# Patient Record
Sex: Male | Born: 1995 | Race: White | Hispanic: No | Marital: Married | Smoking: Never smoker
Health system: Southern US, Community
[De-identification: ages and names within clinical notes are randomized; demographics above are authoritative.]

## PROBLEM LIST (undated history)

## (undated) DIAGNOSIS — T7840XA Allergy, unspecified, initial encounter: Secondary | ICD-10-CM

## (undated) DIAGNOSIS — K121 Other forms of stomatitis: Secondary | ICD-10-CM

## (undated) DIAGNOSIS — K611 Rectal abscess: Secondary | ICD-10-CM

## (undated) DIAGNOSIS — K529 Noninfective gastroenteritis and colitis, unspecified: Secondary | ICD-10-CM

## (undated) DIAGNOSIS — K603 Anal fistula, unspecified: Secondary | ICD-10-CM

## (undated) DIAGNOSIS — K219 Gastro-esophageal reflux disease without esophagitis: Secondary | ICD-10-CM

## (undated) DIAGNOSIS — K5090 Crohn's disease, unspecified, without complications: Secondary | ICD-10-CM

## (undated) HISTORY — PX: UPPER GASTROINTESTINAL ENDOSCOPY: SHX188

## (undated) HISTORY — DX: Noninfective gastroenteritis and colitis, unspecified: K52.9

## (undated) HISTORY — DX: Anal fistula, unspecified: K60.30

## (undated) HISTORY — DX: Gastro-esophageal reflux disease without esophagitis: K21.9

## (undated) HISTORY — PX: COLONOSCOPY: SHX174

## (undated) HISTORY — DX: Rectal abscess: K61.1

## (undated) HISTORY — PX: NO PAST SURGERIES: SHX2092

## (undated) HISTORY — PX: WISDOM TOOTH EXTRACTION: SHX21

## (undated) HISTORY — DX: Crohn's disease, unspecified, without complications: K50.90

## (undated) HISTORY — DX: Other forms of stomatitis: K12.1

## (undated) HISTORY — DX: Anal fistula: K60.3

## (undated) HISTORY — DX: Allergy, unspecified, initial encounter: T78.40XA

---
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
## 2019-03-27 ENCOUNTER — Other Ambulatory Visit: Payer: Self-pay

## 2019-03-27 DIAGNOSIS — Z20822 Contact with and (suspected) exposure to covid-19: Secondary | ICD-10-CM

## 2019-03-28 LAB — NOVEL CORONAVIRUS, NAA: SARS-CoV-2, NAA: NOT DETECTED

## 2019-08-25 ENCOUNTER — Ambulatory Visit: Payer: 59 | Attending: Internal Medicine

## 2019-08-25 DIAGNOSIS — Z20822 Contact with and (suspected) exposure to covid-19: Secondary | ICD-10-CM

## 2019-08-27 ENCOUNTER — Telehealth: Payer: Self-pay | Admitting: Adult Health

## 2019-08-27 LAB — NOVEL CORONAVIRUS, NAA: SARS-CoV-2, NAA: DETECTED — AB

## 2019-08-27 NOTE — Telephone Encounter (Signed)
Patient called about Positive Covid test.  2 patient identifiers confirmed.  Date Tested: 08/25/2019  Date of Symptom onset: 08/25/2019   Symptoms: Mild      Isolation Recommendations:  Patient understands the needs to stay in isolation for a total of 10 days from onset of symptom or 14 days total from date of testing if no symptom. Reviewed Masking.    Supportive Care Recommendations: Encouraged plenty of fluid intake, Tylenol per package directions, and to remain as active as possible.    Patient knows the health department may be in touch.    I answered all of patient's questions and all concerns addressed.  Time Spent: 8 minutes  Wilber Bihari, NP

## 2019-12-13 DIAGNOSIS — K121 Other forms of stomatitis: Secondary | ICD-10-CM | POA: Insufficient documentation

## 2019-12-13 DIAGNOSIS — J039 Acute tonsillitis, unspecified: Secondary | ICD-10-CM | POA: Insufficient documentation

## 2019-12-13 DIAGNOSIS — R438 Other disturbances of smell and taste: Secondary | ICD-10-CM | POA: Insufficient documentation

## 2020-01-05 ENCOUNTER — Other Ambulatory Visit (INDEPENDENT_AMBULATORY_CARE_PROVIDER_SITE_OTHER): Payer: 59

## 2020-01-05 ENCOUNTER — Encounter: Payer: Self-pay | Admitting: Physician Assistant

## 2020-01-05 ENCOUNTER — Ambulatory Visit: Payer: 59 | Admitting: Physician Assistant

## 2020-01-05 ENCOUNTER — Other Ambulatory Visit: Payer: 59

## 2020-01-05 ENCOUNTER — Telehealth: Payer: Self-pay

## 2020-01-05 VITALS — BP 104/60 | HR 64 | Ht 71.0 in | Wt 150.1 lb

## 2020-01-05 DIAGNOSIS — K6289 Other specified diseases of anus and rectum: Secondary | ICD-10-CM

## 2020-01-05 DIAGNOSIS — K625 Hemorrhage of anus and rectum: Secondary | ICD-10-CM | POA: Diagnosis not present

## 2020-01-05 DIAGNOSIS — R1084 Generalized abdominal pain: Secondary | ICD-10-CM

## 2020-01-05 DIAGNOSIS — K611 Rectal abscess: Secondary | ICD-10-CM

## 2020-01-05 DIAGNOSIS — R194 Change in bowel habit: Secondary | ICD-10-CM

## 2020-01-05 DIAGNOSIS — R140 Abdominal distension (gaseous): Secondary | ICD-10-CM

## 2020-01-05 LAB — CBC WITH DIFFERENTIAL/PLATELET
Basophils Absolute: 0.1 10*3/uL (ref 0.0–0.1)
Basophils Relative: 0.9 % (ref 0.0–3.0)
Eosinophils Absolute: 0.3 10*3/uL (ref 0.0–0.7)
Eosinophils Relative: 3.7 % (ref 0.0–5.0)
HCT: 41.4 % (ref 39.0–52.0)
Hemoglobin: 13.7 g/dL (ref 13.0–17.0)
Lymphocytes Relative: 18 % (ref 12.0–46.0)
Lymphs Abs: 1.3 10*3/uL (ref 0.7–4.0)
MCHC: 33.1 g/dL (ref 30.0–36.0)
MCV: 81.5 fl (ref 78.0–100.0)
Monocytes Absolute: 0.8 10*3/uL (ref 0.1–1.0)
Monocytes Relative: 11.5 % (ref 3.0–12.0)
Neutro Abs: 4.6 10*3/uL (ref 1.4–7.7)
Neutrophils Relative %: 65.9 % (ref 43.0–77.0)
Platelets: 281 10*3/uL (ref 150.0–400.0)
RBC: 5.08 Mil/uL (ref 4.22–5.81)
RDW: 14.3 % (ref 11.5–15.5)
WBC: 7 10*3/uL (ref 4.0–10.5)

## 2020-01-05 LAB — COMPREHENSIVE METABOLIC PANEL
ALT: 11 U/L (ref 0–53)
AST: 13 U/L (ref 0–37)
Albumin: 4.1 g/dL (ref 3.5–5.2)
Alkaline Phosphatase: 75 U/L (ref 39–117)
BUN: 14 mg/dL (ref 6–23)
CO2: 30 mEq/L (ref 19–32)
Calcium: 9.3 mg/dL (ref 8.4–10.5)
Chloride: 105 mEq/L (ref 96–112)
Creatinine, Ser: 1.04 mg/dL (ref 0.40–1.50)
GFR: 88.06 mL/min (ref >60.00)
Glucose, Bld: 94 mg/dL (ref 70–99)
Potassium: 5.1 mEq/L (ref 3.5–5.1)
Sodium: 138 mEq/L (ref 135–145)
Total Bilirubin: 0.3 mg/dL (ref 0.2–1.2)
Total Protein: 7.5 g/dL (ref 6.0–8.3)

## 2020-01-05 LAB — SEDIMENTATION RATE: Sed Rate: 34 mm/hr — ABNORMAL HIGH (ref 0–15)

## 2020-01-05 LAB — IGA: IgA: 341 mg/dL (ref 68–378)

## 2020-01-05 LAB — C-REACTIVE PROTEIN: CRP: 2.9 mg/dL (ref 0.5–20.0)

## 2020-01-05 MED ORDER — SUTAB 1479-225-188 MG PO TABS: 1.0000 | ORAL_TABLET | Freq: Once | ORAL | 0 refills | Status: AC

## 2020-01-05 MED ORDER — METOCLOPRAMIDE HCL 10 MG PO TABS
ORAL_TABLET | ORAL | 0 refills | Status: DC
Start: 2020-01-05 — End: 2020-02-21

## 2020-01-05 MED ORDER — AMOXICILLIN-POT CLAVULANATE 500-125 MG PO TABS
1.0000 | ORAL_TABLET | Freq: Two times a day (BID) | ORAL | 0 refills | Status: DC
Start: 2020-01-05 — End: 2020-02-21

## 2020-01-05 MED ORDER — HYDROCORTISONE (PERIANAL) 2.5 % EX CREA: 1.0000 "application " | TOPICAL_CREAM | Freq: Two times a day (BID) | CUTANEOUS | 1 refills | Status: DC

## 2020-01-05 NOTE — Progress Notes (Signed)
Chief Complaint: Blood in stool, rectal pain, change in bowel habits, abdominal pain  HPI:    Carlos Jackson is a 24 year old male with past medical history as listed below, who presents to clinic today with a complaint of blood in his stool, rectal pain, change in bowel habits, abdominal pain.      03/14/2015 notes from PCP show that patient had discussed chronic constipation with rectal bleeding in the past.  Apparently had been seen in a walk-in clinic and told he had an anal fissure and was treated with Preparation H.    Patient had Covid in January of this year.    Today, the patient presents to clinic accompanied by his mother who does assist with history.  He has multiple GI issues which have been going on for years.  Starts by explaining that when he was 13 or 14 he started to have issues with constipation and had a fissure at some point.  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  Tells me if he eats dairy now he will have straight up diarrhea.  Also has a history of vomiting, apparently this occurred almost anytime he ate a few years back and so he did not eat a lot as he was nervous and anxious to vomit again.  He lost a lot of weight at that time.  In 2018 he continued with reflux and episodes of vomiting and had an EGD at [facility].  We cannot see the report in the computer but he tells me he had something biopsied and it came back okay.  Explains that over the past 2 years he has not had real trouble with constipation or diarrhea but is just "irregular".  Explains that he will see bright red blood on the tissue paper/from his rectum about 1 time a month.  He also has "itchy and painful hemorrhoids".  He sometimes uses over-the-counter Preparation H for these.  Associated symptoms include aches and chills at times later in the day as well as fatigue.    Another large complaint today is that over the past 2 to 3 weeks he has developed a "bump" in between his  rectum and testicles which he can express liquid out of, sometimes it is just gas and other times it is a Pacey/clear liquid or even sometimes blood-tinged liquid.  Along with this has almost constant leakage from his anus.    Also describes a history of mouth sores telling me that these are not "aphthous ulcers", but sores which open up in his mouth and make it harder for him to eat comfortably.    Also history of reflux which he does not feel is a big problem at this point in time but does describe a lot of gas when he eats and sometimes having to lay down for 45 minutes to get gas out of his system after eating anything.  Apparently was on Nexium at some point but his wife is trying to change his diet to a cleaner one so that he does not have to be on medicine.  He will use Tums occasionally.  Also tells me that he eats late at night around 8:00 and will go to bed around 10 which he thinks is not helping.    History of being diagnosed with Covid in January and then had a staph infection for which he was on 2 weeks of antibiotics and then strep throat for which he was on another 10  days of antibiotics.    Denies fever or symptoms that awaken him from sleep.  Past Medical History:  Diagnosis Date  . Anal fissure 2014    Past Surgical History:  Procedure Laterality Date  . NO PAST SURGERIES      No current outpatient medications on file.   No current facility-administered medications for this visit.    Allergies as of 01/05/2020  . (No Known Allergies)    Family History  Problem Relation Age of Onset  . Depression Mother   . Anxiety disorder Mother   . OCD Mother   . Breast cancer Maternal Grandmother   . Hypertension Maternal Grandfather   . Heart block Maternal Grandfather   . Crohn's disease Cousin     Social History   Socioeconomic History  . Marital status: Married    Spouse name: Not on file  . Number of children: 0  . Years of education: Not on file  . Highest  education level: Not on file  Occupational History  . 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 Smoking status: Never Smoker  . Smokeless tobacco: Never Used  Substance and Sexual Activity  . Alcohol use: Yes    Comment: rarely  . Drug use: Never  . Sexual activity: Not on file  Other Topics Concern  . Not on file  Social History Narrative  . Not on file   Social Determinants of Health   Financial Resource Strain:   . Difficulty of Paying Living Expenses:   Food Insecurity:   . Worried About Charity fundraiser in the Last Year:   . Arboriculturist in the Last Year:   Transportation Needs:   . Film/video editor (Medical):   Marland Kitchen Lack of Transportation (Non-Medical):   Physical Activity:   . Days of Exercise per Week:   . Minutes of Exercise per Session:   Stress:   . Feeling of Stress :   Social Connections:   . Frequency of Communication with Friends and Family:   . Frequency of Social Gatherings with Friends and Family:   . Attends Religious Services:   . Active Member of Clubs or Organizations:   . Attends Archivist Meetings:   Marland Kitchen Marital Status:   Intimate Partner Violence:   . Fear of Current or Ex-Partner:   . Emotionally Abused:   Marland Kitchen Physically Abused:   . Sexually Abused:     Review of Systems:    Constitutional: No weight loss, fever or chills Skin: No rash or itching Cardiovascular: No chest pain  Respiratory: No SOB  Gastrointestinal: See HPI and otherwise negative Genitourinary: No dysuria  Neurological: No headache, dizziness or syncope Musculoskeletal: No new muscle or joint pain Hematologic: No  bruising Psychiatric: +anxiety   Physical Exam:  Vital signs: BP 104/60 (BP Location: Left Arm, Patient Position: Sitting, Cuff Size: Normal)   Pulse 64 Comment: irregular  Ht 5' 11"  (1.803 m) Comment: height measured without shoes  Wt 150 lb 2 oz (68.1 kg)   BMI 20.94 kg/m   Constitutional:   Pleasant Caucasian male appears to  be in NAD, Well developed, Well nourished, alert and cooperative Head:  Normocephalic and atraumatic. Eyes:   PEERL, EOMI. No icterus. Conjunctiva pink. Ears:  Normal auditory acuity. Neck:  Supple Throat: Oral cavity and pharynx without inflammation, swelling or lesion.  Respiratory: Respirations even and unlabored. Lungs clear to auscultation bilaterally.   No wheezes, crackles, or rhonchi.  Cardiovascular: Normal  S1, S2. No MRG. Regular rate and rhythm. No peripheral edema, cyanosis or pallor.  Gastrointestinal:  Soft, nondistended, mild generalized abdominal ttp. No rebound or guarding. Normal bowel sounds. No appreciable masses or hepatomegaly. Rectal: External: dermatitis along buttocks, moisture, multiple external hemorrhoids, fluctuant area on the left side of perineum which expresses bloody discharge out the rectum when palpated; Internal: hemorrhoids Msk:  Symmetrical without gross deformities. Without edema, no deformity or joint abnormality.  Neurologic:  Alert and  oriented x4;  grossly normal neurologically.  Skin:   Dry and intact without significant lesions or rashes. Psychiatric: Demonstrates good judgement and reason without abnormal affect or behaviors.  Assessment: 1.  Change in bowel habits: Some days of constipation and other days of regular stool, with mouth ulcers and rectal fistula/abscess and fissures consider IBD versus other 2.  Perirectal abscess: Palpated during time of exam today 3.  Rectal bleeding: Likely to related to above plus/minus hemorrhoids 4.  Mouth ulcers 5.  Generalized abdominal pain 6.  Heartburn/reflux/nausea: Question functional component given anxiety over GI system 7.  Bloating/gas  Plan: 1.  Ordered a CT of the pelvis with contrast for further evaluation of what appears to be an abscess. 2.  Scheduled patient for colonoscopy in the [place] with Dr. Hilarie Fredrickson.  Did discuss risks, benefits, limitations and alternatives and patient agrees to proceed.   This was scheduled a few weeks out to allow for CT to come back. 3.  Prescribed Hydrocortisone ointment to be applied twice daily to the buttocks and hemorrhoids for 2 weeks. 4.  Prescribed Augmentin 581m BIDx5 days for suspected abscess #10 no refills 5.  Continue high-fiber diet, probiotic and water. 6.  Prescribe Metoclopramide 10 mg #2, to be taken 20 to 30 minutes before each half of prep 7.  Ordered labs to include a CBC, CMP, CRP, ESR and celiac testing 8.  Patient to follow in clinic per recommendations from Dr. PHilarie Fredricksonafter time of procedure.  JEllouise Newer PA-C [facility] 01/05/2020, 10:38 AM

## 2020-01-05 NOTE — Patient Instructions (Signed)
.  If you are age 24 or older, your body mass index should be between 23-30. Your Body mass index is 20.94 kg/m. If this is out of the aforementioned range listed, please consider follow up with your Primary Care Provider.  If you are age 12 or younger, your body mass index should be between 19-25. Your Body mass index is 20.94 kg/m. If this is out of the aformentioned range listed, please consider follow up with your Primary Care Provider.   Your provider has requested that you go to the basement level for lab work before leaving today. Press "B" on the elevator. The lab is located at the first door on the left as you exit the elevator.  We have sent the following medications to your pharmacy for you to pick up at your convenience: Hydrocortisone ointment to peri-anal area twice daily for 14 days. Reglan 10 mg - take one tablet 30 minutes before each half of prep.    You have been scheduled for a CT scan of the abdomen and pelvis at [facility] ([address]---this is in the same building as [employer]).   You are scheduled on Thursday 01/11/20 at 2:30 pm. You should arrive 15 minutes prior to your appointment time for registration. Please follow the written instructions below on the day of your exam:  WARNING: IF YOU ARE ALLERGIC TO IODINE/X-RAY DYE, PLEASE NOTIFY RADIOLOGY IMMEDIATELY AT [phone]! YOU WILL BE GIVEN A 13 HOUR PREMEDICATION PREP.  1) Do not eat or drink anything after 10:30 am (4 hours prior to your test) 2) You have been given 2 bottles of oral contrast to drink. The solution may taste better if refrigerated, but do NOT add ice or any other liquid to this solution. Shake well before drinking.    Drink 1 bottle of contrast @ 12:30 pm (2 hours prior to your exam)  Drink 1 bottle of contrast @ 1:30 pm (1 hour prior to your exam)  You may take any medications as prescribed with a small amount of water, if necessary. If you take any of the  following medications: METFORMIN, GLUCOPHAGE, GLUCOVANCE, AVANDAMET, RIOMET, FORTAMET, [place] MET, JANUMET, GLUMETZA or METAGLIP, you MAY be asked to HOLD this medication 48 hours AFTER the exam.  The purpose of you drinking the oral contrast is to aid in the visualization of your intestinal tract. The contrast solution may cause some diarrhea. Depending on your individual set of symptoms, you may also receive an intravenous injection of x-ray contrast/dye. Plan on being at [facility] for 30 minutes or longer, depending on the type of exam you are having performed.  This test typically takes 30-45 minutes to complete.  If you have any questions regarding your exam or if you need to reschedule, you may call the CT department at [phone] between the hours of 8:00 am and 5:00 pm, Monday-Friday.  __________________________________________________________________  Carlos Jackson have been scheduled for a colonoscopy. Please follow written instructions given to you at your visit today.  Please pick up your prep supplies at the pharmacy within the next 1-3 days. If you use inhalers (even only as needed), please bring them with you on the day of your procedure.  Follow up in clinic per recommendations from Dr. Hilarie Fredrickson after procedure.

## 2020-01-05 NOTE — Telephone Encounter (Signed)
Lm on vm that per Carlos Jackson, I sent in [place] for him to take twice a day for 5 days.

## 2020-01-09 LAB — TISSUE TRANSGLUTAMINASE ABS,IGG,IGA
(tTG) Ab, IgA: <1 U/mL
(tTG) Ab, IgG: 2 U/mL

## 2020-01-09 NOTE — Progress Notes (Signed)
Addendum: Reviewed and agree with assessment and management plan. Kaye Luoma M, MD  

## 2020-01-11 ENCOUNTER — Ambulatory Visit: Payer: Self-pay

## 2020-01-11 ENCOUNTER — Other Ambulatory Visit: Payer: Self-pay

## 2020-01-11 ENCOUNTER — Ambulatory Visit (INDEPENDENT_AMBULATORY_CARE_PROVIDER_SITE_OTHER)
Admission: RE | Admit: 2020-01-11 | Discharge: 2020-01-11 | Disposition: A | Discharge status: Home / Self Care | Payer: 59 | Source: Ambulatory Visit | Attending: Physician Assistant | Admitting: Physician Assistant

## 2020-01-11 DIAGNOSIS — K611 Rectal abscess: Secondary | ICD-10-CM | POA: Diagnosis not present

## 2020-01-11 DIAGNOSIS — K6289 Other specified diseases of anus and rectum: Secondary | ICD-10-CM | POA: Diagnosis not present

## 2020-01-11 DIAGNOSIS — K625 Hemorrhage of anus and rectum: Secondary | ICD-10-CM

## 2020-01-11 DIAGNOSIS — R1084 Generalized abdominal pain: Secondary | ICD-10-CM | POA: Diagnosis not present

## 2020-01-11 DIAGNOSIS — R194 Change in bowel habit: Secondary | ICD-10-CM

## 2020-01-11 MED ORDER — IOHEXOL 300 MG/ML  SOLN
100.0000 mL | Freq: Once | INTRAMUSCULAR | Status: AC | PRN
  Administered 2020-01-11: 100 mL via INTRAVENOUS

## 2020-01-11 NOTE — Telephone Encounter (Signed)
Patient called stating that he had a CT scan done today because he has a area at his anus that is unusual He states the GI doctor thinks it might be an abscess. He says this area leaks fluid. Today he had leaking and some bleeding but is most concerned because he found a pea gravel size piece of tissue. He denies pain itching. He has no fever. Patient was advised to try to reach his PCP tonight for advice. He was told he should go to er for severe pain bleeding that continues or fever. Any unusual symptom. He verbalized understanding and will try to reach his PCP office on call. He will also contact his GI physician.  Reason for Disposition . [1] Home treatment > 3 days for rectal pain AND [2] not improved  Answer Assessment - Initial Assessment Questions 1. SYMPTOM:  "What's the main symptom you're concerned about?" (e.g., pain, itching, swelling, rash)     Leaking and with tissue 2nd time bloody 2. ONSET: "When did the lastweek saw GI start?"     Rectal bleeding 3. RECTAL PAIN: "Do you have any pain around your rectum?" "How bad is the pain?"  (Scale 1-10; or mild, moderate, severe)  - MILD (1-3): doesn't interfere with normal activities   - MODERATE (4-7): interferes with normal activities or awakens from sleep, limping   - SEVERE (8-10): excruciating pain, unable to have a bowel movement      none 4. RECTAL ITCHING: "Do you have any itching in this area?" "How bad is the itching?"  (Scale 1-10; or mild, moderate, severe)  - MILD - doesn't interfere with normal activities   - MODERATE-SEVERE: interferes with normal activities or awakens from sleep   extermal hemrhoids 5. CONSTIPATION: "Do you have constipation?" If so, "How bad is it?"   no 6. CAUSE: "What do you think is causing the anus symptoms?"     Unsure CT for most symptoms 7. OTHER SYMPTOMS: "Do you have any other symptoms?"  (e.g., rectal bleeding, abdominal pain, vomiting, fever)     blood 8. PREGNANCY: "Is there any chance you  are pregnant?" "When was your last menstrual period?"    N/A  Protocols used: RE[facility]AL [facility]

## 2020-01-12 ENCOUNTER — Telehealth: Payer: Self-pay | Admitting: Physician Assistant

## 2020-01-12 NOTE — Telephone Encounter (Signed)
Pt stated that he had a bm and passed "a small piece of tissue" after he had CT.  Pt is concerned and would like a call back.

## 2020-01-12 NOTE — Telephone Encounter (Signed)
The pt had CT scan yesterday and states he passed what looks like tissue.  He said it was a small amount of BRB at that time and then everything went back to normal for him.  He will await a call for CT results.

## 2020-01-23 NOTE — Telephone Encounter (Signed)
Rather than remaining on Augmentin, I would recommend we try metronidazole 250 mg 3 times daily for 3 to 4 weeks

## 2020-01-24 ENCOUNTER — Telehealth: Payer: Self-pay | Admitting: Gastroenterology

## 2020-01-24 ENCOUNTER — Telehealth: Payer: Self-pay | Admitting: Physician Assistant

## 2020-01-24 ENCOUNTER — Other Ambulatory Visit: Payer: Self-pay

## 2020-01-24 MED ORDER — METRONIDAZOLE 250 MG PO TABS
250.0000 mg | ORAL_TABLET | Freq: Three times a day (TID) | ORAL | 0 refills | Status: DC
Start: 2020-01-24 — End: 2020-10-01

## 2020-01-24 NOTE — Telephone Encounter (Signed)
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 CRP, albumin, hemoglobin, MCV normal. ESR elevated at 34.  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 Colonoscopy planned later this month for further evaluation of possible Crohn's disease. The evening of the CT scan he passed a piece of tissue/mucosa that was worrisome to him. Felt like this concern was not addressed after sending a MyChart message about it. He has had normal bowel movements since then. Called the office yesterday with concerns for increasing fistula pain, severe enough that he has been avoiding any movement. He was started on Flagyl yesterday and took the first dose earlier today.   This evening, pain escalated followed by a pop. He has noticed more purulent discharge since that time, although the pain may be somewhat improved. No fever, chills, or night sweats.   Discussed diagnosis and management of fistula +/- abscess. Given patient's discomfort and anxiety about his diagnosis, I recommended that he go to the ER for evaluation as I am unable to fully evaluate his concerns/perform anorectal exam over the phone.   Will update Dr. Hilarie Fredrickson to arrange for office follow-up, endoscopic evaluation, and surgical consultation.   Vaughan Basta, please call the patient 01/25/20 for follow-up in his symptoms.

## 2020-01-24 NOTE — Telephone Encounter (Signed)
Please see MyChart message.  Pt reported that he has finished his course of antibiotics and is experiencing pain.  He stated that "it hurts to stand and to sit."  Pt requested to speak to a nurse.  He stated that "I keep getting MyChart messages but no one has called me to address this issue."

## 2020-01-24 NOTE — Telephone Encounter (Signed)
Spoke with pt and he is aware of Dr. Quentin Mulling recommendations, antibiotic sent to pharmacy and pt knows to start Flagyl.

## 2020-01-25 ENCOUNTER — Other Ambulatory Visit: Payer: Self-pay

## 2020-01-25 ENCOUNTER — Emergency Department (HOSPITAL_COMMUNITY)
Admission: EM | Admit: 2020-01-25 | Discharge: 2020-01-25 | Disposition: A | Discharge status: Home / Self Care | Payer: 59 | Attending: Emergency Medicine | Admitting: Emergency Medicine

## 2020-01-25 ENCOUNTER — Encounter (HOSPITAL_COMMUNITY): Payer: Self-pay

## 2020-01-25 ENCOUNTER — Emergency Department (HOSPITAL_COMMUNITY): Payer: 59

## 2020-01-25 DIAGNOSIS — K603 Anal fistula: Secondary | ICD-10-CM | POA: Diagnosis not present

## 2020-01-25 DIAGNOSIS — K6289 Other specified diseases of anus and rectum: Secondary | ICD-10-CM | POA: Diagnosis present

## 2020-01-25 DIAGNOSIS — Z79899 Other long term (current) drug therapy: Secondary | ICD-10-CM | POA: Diagnosis not present

## 2020-01-25 LAB — CBC
HCT: 44.7 % (ref 39.0–52.0)
Hemoglobin: 14.2 g/dL (ref 13.0–17.0)
MCH: 27 pg (ref 26.0–34.0)
MCHC: 31.8 g/dL (ref 30.0–36.0)
MCV: 85.1 fL (ref 80.0–100.0)
Platelets: 253 10*3/uL (ref 150–400)
RBC: 5.25 MIL/uL (ref 4.22–5.81)
RDW: 13.3 % (ref 11.5–15.5)
WBC: 5.8 10*3/uL (ref 4.0–10.5)
nRBC: 0 % (ref 0.0–0.2)

## 2020-01-25 LAB — BASIC METABOLIC PANEL
Anion gap: 9 (ref 5–15)
BUN: 20 mg/dL (ref 6–20)
CO2: 28 mmol/L (ref 22–32)
Calcium: 9 mg/dL (ref 8.9–10.3)
Chloride: 105 mmol/L (ref 98–111)
Creatinine, Ser: 0.97 mg/dL (ref 0.61–1.24)
GFR calc Af Amer: >60 mL/min (ref >60)
GFR calc non Af Amer: >60 mL/min (ref >60)
Glucose, Bld: 92 mg/dL (ref 70–99)
Potassium: 4.2 mmol/L (ref 3.5–5.1)
Sodium: 142 mmol/L (ref 135–145)

## 2020-01-25 MED ORDER — GADOBUTROL 1 MMOL/ML IV SOLN
7.0000 mL | Freq: Once | INTRAVENOUS | Status: AC | PRN
  Administered 2020-01-25: 7 mL via INTRAVENOUS

## 2020-01-25 MED ORDER — SENNOSIDES-DOCUSATE SODIUM 8.6-50 MG PO TABS: 1.0000 | ORAL_TABLET | Freq: Every day | ORAL | 0 refills | Status: DC

## 2020-01-25 MED ORDER — MIDAZOLAM HCL 2 MG/2ML IJ SOLN
2.0000 mg | Freq: Once | INTRAMUSCULAR | Status: AC | PRN
  Administered 2020-01-25: 2 mg via INTRAVENOUS
  Filled 2020-01-25: qty 2

## 2020-01-25 NOTE — Discharge Instructions (Signed)
Your GI doctor's office should call you today or tomorrow to arrange follow up, and possibly move up the date of your colonoscopy.  Please keep taking your metronidazole (flagyl) antibiotic at home to cover for possible infections.  Do not drink alcohol while taking this antibiotic - it will make you sick.  Your GI doctor will determine whether you need to see a colorectal surgeon for your fistula.  Your MRI today did not show a drainable abscess or deep infection needing emergent surgery.

## 2020-01-25 NOTE — ED Notes (Signed)
Discharge paperwork discussed with patient and wife.

## 2020-01-25 NOTE — Telephone Encounter (Signed)
Thanks to Dr. Tarri Glenn for her assistance last night. Pt is being evaluated in the ER currently I have not met the patient before, but he was seen recently by Ellouise Newer, [facility] and has CT scan abd/pelvis  I can try and expedite his evaluation.  Colonoscopy could be moved to next Tuesday, 01/30/2020 at 1:30 PM if patient agreeable I also would recommend we proceed with MRI pelvis with contrast to better image and delineate rectal/perianal fistula/pathology He can continue the metronidazole 250 mg TID as prescribed  Please let me know if he has questions

## 2020-01-25 NOTE — ED Triage Notes (Signed)
Pt presents with c/o possible anal fistula rupture. 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 Last night, pt felt a "pop" and had some fluid drain from his rectum, both blood and pus. Pt reports the liquid was pink in nature Pt's on-call GI reported there may also be an abscess that ruptured that they did not see on the CT.

## 2020-01-25 NOTE — ED Notes (Signed)
Patient to MRI.

## 2020-01-25 NOTE — ED Provider Notes (Signed)
[facility] DEPT Provider Note   CSN: [id number] Arrival date & time: 01/25/20  0813     History Chief Complaint  Patient presents with  . Fistula rupture    Carlos Jackson is a 24 y.o. male who is currently undergoing evaluation for Crohn's disease present emergency department with possible anal abscess or fistula rupture.  Patient reports that he began having swelling and pain in his perianal region approximately 3 days ago.  He says that 2 days ago he felt a pop and began having leakage of fluid from around his anus, both inside his anus and externally.  He said it was pink and purulent looking fluid.  It was dripping steadily.  He called [facility] yesterday evening and spoke to the oncall provider, who started him on Flagyl (he has taken 3 doses so far) and advised coming to the ER for evaluation.  Patient has endoscopy planned for 6/30 with Dr Hilarie Fredrickson for Crohn's/IBS evaluation.    He had a CT abdomen/pelvis on 01/11/20 which showed:   IMPRESSION: 1. Left-sided perianal fistula, incompletely visualized on this exam. No abscess identified. Pelvic MRI without contrast could be for further evaluation if desired. 2. Large stool burden noted, possibly due to constipation.   No other medical problems, NKDA  HPI     Past Medical History:  Diagnosis Date  . Anal fissure 2014    There are no problems to display for this patient.   Past Surgical History:  Procedure Laterality Date  . NO PAST SURGERIES         Family History  Problem Relation Age of Onset  . Depression Mother   . Anxiety disorder Mother   . OCD Mother   . Breast cancer Maternal Grandmother   . Hypertension Maternal Grandfather   . Heart block Maternal Grandfather   . Crohn's disease Cousin     Social History   Tobacco Use  . Smoking status: Never Smoker  . Smokeless tobacco: Never Used  Vaping Use  . Vaping Use: Never used  Substance Use Topics  . Alcohol use:  Yes    Comment: rarely  . Drug use: Never    Home Medications Prior to Admission medications   Medication Sig Start Date End Date Taking? Authorizing Provider  hydrocortisone (ANUSOL-HC) 2.5 % rectal cream Place 1 application rectally 2 (two) times daily. 01/05/20  Yes Levin Erp, PA  metroNIDAZOLE (FLAGYL) 250 MG tablet Take 1 tablet (250 mg total) by mouth 3 (three) times daily. 01/24/20  Yes Pyrtle, Lajuan Lines, MD  amoxicillin-clavulanate (AUGMENTIN) 500-125 MG tablet Take 1 tablet (500 mg total) by mouth in the morning and at bedtime. Patient not taking: Reported on 01/25/2020 01/05/20   Levin Erp, PA  metoCLOPramide (REGLAN) 10 MG tablet Take 1 tablet 30 minutes before each half of prep for colonoscopy. 01/05/20   Levin Erp, PA  senna-docusate (SENOKOT-S) 8.6-50 MG tablet Take 1 tablet by mouth daily for 60 doses. 01/25/20 03/25/20  Wyvonnia Dusky, MD    Allergies    Lactose intolerance (gi)  Review of Systems   Review of Systems  Constitutional: Negative for chills and fever.  Respiratory: Negative for cough and shortness of breath.   Cardiovascular: Negative for chest pain and palpitations.  Gastrointestinal: Positive for blood in stool and rectal pain. Negative for nausea and vomiting.  Genitourinary: Negative for difficulty urinating, dysuria and hematuria.  Musculoskeletal: Negative for arthralgias and back pain.  Skin: Negative for color  change and rash.  Neurological: Negative for syncope and light-headedness.  Psychiatric/Behavioral: Negative for agitation and confusion.  All other systems reviewed and are negative.   Physical Exam Updated Vital Signs BP 119/70   Pulse 82   Temp 98.4 F (36.9 C) (Oral)   Resp 18   SpO2 100%   Physical Exam Vitals and nursing note reviewed.  Constitutional:      Appearance: He is well-developed.  HENT:     Head: Normocephalic and atraumatic.  Eyes:     Conjunctiva/sclera: Conjunctivae normal.    Cardiovascular:     Rate and Rhythm: Normal rate and regular rhythm.     Heart sounds: No murmur heard.   Pulmonary:     Effort: Pulmonary effort is normal. No respiratory distress.     Breath sounds: Normal breath sounds.  Abdominal:     Palpations: Abdomen is soft.     Tenderness: There is no abdominal tenderness.  Genitourinary:    Comments: GU exam with small region of induration at the base of the scrotum (anterior perineum) with small tract mark, no active drainage from this Rectal exam with palpable internal hemorrhoid, no significant tenderness or palpable internal abscess Musculoskeletal:     Cervical back: Neck supple.  Skin:    General: Skin is warm and dry.  Neurological:     Mental Status: He is alert.     ED Results / Procedures / Treatments   Labs (all labs ordered are listed, but only abnormal results are displayed) Labs Reviewed  BASIC METABOLIC PANEL  CBC    EKG None  Radiology MR PELVIS W WO CONTRAST  Result Date: 01/25/2020 CLINICAL DATA:  Perianal fistula that ruptured last night. Blood in stool. Weight loss. EXAM: MRI PELVIS WITHOUT AND WITH CONTRAST TECHNIQUE: Multiplanar multisequence MR imaging of the pelvis was performed both before and after administration of intravenous contrast. CONTRAST:  27m GADAVIST GADOBUTROL 1 MMOL/ML IV SOLN COMPARISON:  01/11/2020 CT FINDINGS: Urinary Tract:  Normal urinary bladder. Bowel:  Large colonic stool burden.  No active proctitis. A low perianal fistula originates at the 12 to 12:30 position on 23/7. Extends anteriorly, favored to traverse the external sphincter (parks classification B). Extends to the anterior aspect of the left gluteal crease and medial left perineum, including on 27/7 and 27/10. No drainable abscess. Vascular/Lymphatic: Patent pelvic vasculature. No pelvic sidewall adenopathy. Reproductive: Normal prostate and seminal vesicles with trace, likely physiologic scrotal fluid. Other:  No significant  free fluid. Musculoskeletal: Transitional S1 vertebral body. IMPRESSION: Low perianal fistula tracking to the anterior aspect of the left gluteal crease/medial left perineum. No evidence of drainable abscess or other acute complication. Possible constipation. Electronically Signed   By: KAbigail MiyamotoM.D.   On: 01/25/2020 12:05    Procedures Procedures (including critical care time)  Medications Ordered in ED Medications  midazolam (VERSED) injection 2 mg (2 mg Intravenous Given 01/25/20 1047)  gadobutrol (GADAVIST) 1 MMOL/ML injection 7 mL (7 mLs Intravenous Contrast Given 01/25/20 1147)    ED Course  I have reviewed the triage vital signs and the nursing notes.  Pertinent labs & imaging results that were available during my care of the patient were reviewed by me and considered in my medical decision making (see chart for details).  24yo male w/ perirectal fistula presenting to ED with perineum pain and swelling, suspected abscess that has spontaneously drained for the past 2 days, both via the rectum and the skin.  There is concern for a  new tract development to the perineum, as he is draining from this site.  He has mild induration near base of scrotum but otherwise no large fluctuant abscess palpable externally or on rectal exam.  He is afebrile and clinically well appearing to me.  I doubt sepsis or fourniere's gangrene.  I personally reviewed his labs including CBC and BMP where are unremarkable.  I reviewed his medical chart including GI office notes and CT scan from May 2021.  I spoke to GI and General Surgery and we've agreed to go ahead with a pelvic MRI to better evaluate the fistula situation and look for drainable collections.  The patient is on flagyl already, a good option for empiric coverage.  Clinical Course as of Jan 24 1806  Thu Jan 25, 2020  1047 I spoke to both [facility] and general surgery who agree with MRI pelvis.  If there is a drainable collection we'll discuss  with general surgery again.  Otherwise if he is spontaneously draining, he is okay for discharge home on flagyl.  Dr Hilarie Fredrickson has moved up his colonoscopy date to 6/15.   [MT]  1212 Spoke to GI PA who reports patient is okay to f/u with them this week, continue flagyl, GI MD will determine need for surgical referral after attempting medical optimization.  Patient and his wife informed of this plan and in agreement   [MT]  1222 PT reassessed, feeling okay, hungry, will discharge on flagyl, f/u with GI.   [MT]    Clinical Course User Index [MT] Lovett Coffin, Carola Rhine, MD    Final Clinical Impression(s) / ED Diagnoses Final diagnoses:  Perianal fistula    Rx / DC Orders ED Discharge Orders         Ordered    senna-docusate (SENOKOT-S) 8.6-50 MG tablet  Daily     Discontinue  Reprint     01/25/20 1240           Wyvonnia Dusky, MD 01/25/20 1807

## 2020-01-25 NOTE — ED Notes (Signed)
Patient returned from MRI.

## 2020-01-25 NOTE — Telephone Encounter (Signed)
Pt did not go to the er until this am, he is currently in the ER.

## 2020-01-25 NOTE — ED Notes (Signed)
Provider at bedside

## 2020-01-26 ENCOUNTER — Other Ambulatory Visit: Payer: Self-pay | Admitting: Internal Medicine

## 2020-01-26 ENCOUNTER — Ambulatory Visit (INDEPENDENT_AMBULATORY_CARE_PROVIDER_SITE_OTHER): Payer: 59

## 2020-01-26 ENCOUNTER — Other Ambulatory Visit: Payer: Self-pay

## 2020-01-26 ENCOUNTER — Telehealth: Payer: Self-pay

## 2020-01-26 DIAGNOSIS — Z1159 Encounter for screening for other viral diseases: Secondary | ICD-10-CM

## 2020-01-26 MED ORDER — SUTAB 1479-225-188 MG PO TABS: ORAL_TABLET | ORAL | 0 refills | Status: DC

## 2020-01-26 NOTE — Telephone Encounter (Signed)
-----   Message from Loralie Champagne, PA-C sent at 01/25/2020 12:12 PM EDT ----- Patient was seen in the ED.  MRI pelvis shows fistula but no abscess.  They are sending him home.  Please contact him to move his colonoscopy up to 6/15 if patient able per Dr. Hilarie Fredrickson.  He will continue his Flagyl.  Thank you,  Jess

## 2020-01-26 NOTE — Telephone Encounter (Signed)
Pts colon moved to 6/15@1 :30pm, prep sent to pharmacy, covid test rescheduled for today 01/26/20. Pt aware.

## 2020-01-26 NOTE — Telephone Encounter (Signed)
Pts colon has been moved to 6/15@1 :30pm. Pt aware

## 2020-01-28 ENCOUNTER — Other Ambulatory Visit: Payer: Self-pay | Admitting: Gastroenterology

## 2020-01-29 LAB — SARS CORONAVIRUS 2 (TAT 6-24 HRS): SARS Coronavirus 2: NEGATIVE

## 2020-01-30 ENCOUNTER — Encounter: Payer: Self-pay | Admitting: Internal Medicine

## 2020-01-30 ENCOUNTER — Ambulatory Visit (AMBULATORY_SURGERY_CENTER): Payer: 59 | Admitting: Internal Medicine

## 2020-01-30 ENCOUNTER — Other Ambulatory Visit: Payer: Self-pay

## 2020-01-30 ENCOUNTER — Other Ambulatory Visit (INDEPENDENT_AMBULATORY_CARE_PROVIDER_SITE_OTHER): Payer: 59

## 2020-01-30 VITALS — BP 83/46 | HR 60 | Temp 96.8°F | Resp 12 | Ht 71.0 in | Wt 150.0 lb

## 2020-01-30 DIAGNOSIS — K50813 Crohn's disease of both small and large intestine with fistula: Secondary | ICD-10-CM | POA: Diagnosis not present

## 2020-01-30 DIAGNOSIS — K6289 Other specified diseases of anus and rectum: Secondary | ICD-10-CM | POA: Diagnosis present

## 2020-01-30 DIAGNOSIS — K529 Noninfective gastroenteritis and colitis, unspecified: Secondary | ICD-10-CM | POA: Diagnosis not present

## 2020-01-30 DIAGNOSIS — K5000 Crohn's disease of small intestine without complications: Secondary | ICD-10-CM | POA: Diagnosis not present

## 2020-01-30 DIAGNOSIS — D128 Benign neoplasm of rectum: Secondary | ICD-10-CM | POA: Diagnosis not present

## 2020-01-30 LAB — C-REACTIVE PROTEIN: CRP: <1 mg/dL (ref 0.5–20.0)

## 2020-01-30 MED ORDER — SODIUM CHLORIDE 0.9 % IV SOLN: 500.0000 mL | Freq: Once | INTRAVENOUS | Status: DC

## 2020-01-30 NOTE — Progress Notes (Signed)
To PACU, VSS. Report to [email or web address]

## 2020-01-30 NOTE — Progress Notes (Signed)
Pt's states no medical or surgical changes since previsit or office visit. 

## 2020-01-30 NOTE — Op Note (Signed)
[facility] Patient Name: Carlos Jackson Procedure Date: 01/30/2020 1:31 PM MRN: [id number] Endoscopist: Jerene Bears , MD Age: 23 Referring MD:  Date of Birth: 17-Apr-1996 Gender: Male Account #: [id number] Procedure:                Colonoscopy Indications:              Generalized abdominal pain, Rectal bleeding,                            Abnormal MRI of the pelvis showing perianal                            fistula, change in bowel habits Medicines:                Monitored Anesthesia Care Procedure:                Pre-Anesthesia Assessment:                           - Prior to the procedure, a History and Physical                            was performed, and patient medications and                            allergies were reviewed. The patient's tolerance of                            previous anesthesia was also reviewed. The risks                            and benefits of the procedure and the sedation                            options and risks were discussed with the patient.                            All questions were answered, and informed consent                            was obtained. Prior Anticoagulants: The patient has                            taken no previous anticoagulant or antiplatelet                            agents. ASA Grade Assessment: II - A patient with                            mild systemic disease. After reviewing the risks                            and benefits, the patient was deemed in  satisfactory condition to undergo the procedure.                           After obtaining informed consent, the colonoscope                            was passed under direct vision. Throughout the                            procedure, the patient's blood pressure, pulse, and                            oxygen saturations were monitored continuously. The                            Colonoscope was introduced through the anus  and                            advanced to the terminal ileum. The colonoscopy was                            performed without difficulty. The patient tolerated                            the procedure well. The quality of the bowel                            preparation was good. The terminal ileum, ileocecal                            valve, appendiceal orifice, and rectum were                            photographed. Scope In: 1:46:51 PM Scope Out: 2:10:30 PM Scope Withdrawal Time: 0 hours 19 minutes 46 seconds  Total Procedure Duration: 0 hours 23 minutes 39 seconds  Findings:                 The digital rectal exam findings include                            granularity and palpable fibrotic changes in the                            anal canal. Anal skin tags are present.                           Inflammation, moderate in severity and                            characterized by congestion (edema), erosions,                            erythema and ulcerations was found in the distal  ileum and at IC valve. Biopsies were taken with a                            cold forceps for histology. This involves 5 cm of                            ileum and the more proximal ileum appeared normal.                           The ascending colon and cecum appeared normal.                            Biopsies were taken with a cold forceps for                            histology.                           Inflammation characterized by congestion (edema),                            erosions, erythema and deep ulcerations was found                            as small patches surrounded by normal mucosa in the                            sigmoid colon, in the descending colon and in the                            transverse colon. This was moderate in severity.                            Biopsies were taken with a cold forceps for                            histology. There is  inflammation in the distal                            rectum and anal canal. No fistulous opening was                            seen. This involves 3-4 cm of rectum extending from                            the anal canal/dentate line.                           A 7 mm polyp was found in the distal rectum. The                            polyp was sessile. The polyp was removed with a hot  snare. Resection and retrieval were complete.                           No additional abnormalities were found on                            retroflexion. Complications:            No immediate complications. Estimated Blood Loss:     Estimated blood loss was minimal. Impression:               - Granularity and palpable fibrotic changes found                            on digital rectal exam.                           - Ileitis, consistent with Crohn's disease.                            Biopsied.                           - The ascending colon and cecum are normal.                            Biopsied.                           - Findings are consistent with Crohn's disease.                            Inflammation was found in the anus, in the rectum,                            in the sigmoid colon, in the descending colon and                            in the transverse colon. This was moderate in                            severity. Biopsied.                           - One 7 mm polyp in the distal rectum, removed with                            a hot snare. Resected and retrieved. Recommendation:           - Patient has a contact number available for                            emergencies. The signs and symptoms of potential                            delayed complications were discussed with the  patient. Return to normal activities tomorrow.                            Written discharge instructions were provided to the                             patient.                           - Resume previous diet.                           - Continue present medications.                           - Await pathology results.                           - Labs today (QuantiFeron gold, hepatitis                            serologies, CRP). Anticipate anti-TNF therapy.                           - Office follow-up in 4-6 weeks.                           - No recommendation at this time regarding repeat                            colonoscopy due to young age. Will discuss repeat                            examination in the future to assess response to                            treatment given diagnosis of Crohn's disease Jerene Bears, MD 01/30/2020 2:24:05 PM This report has been signed electronically.

## 2020-01-30 NOTE — Patient Instructions (Signed)
Resume previous diet and medications.  Lab work today on the way out.  Anticipate anti-TNF therapy.  Office follow up in 4-6 weeks.   YOU HAD AN ENDOSCOPIC PROCEDURE TODAY AT [facility]:   Refer to the procedure report that was given to you for any specific questions about what was found during the examination.  If the procedure report does not answer your questions, please call your gastroenterologist to clarify.  If you requested that your care partner not be given the details of your procedure findings, then the procedure report has been included in a sealed envelope for you to review at your convenience later.  YOU SHOULD EXPECT: Some feelings of bloating in the abdomen. Passage of more gas than usual.  Walking can help get rid of the air that was put into your GI tract during the procedure and reduce the bloating. If you had a lower endoscopy (such as a colonoscopy or flexible sigmoidoscopy) you may notice spotting of blood in your stool or on the toilet paper. If you underwent a bowel prep for your procedure, you may not have a normal bowel movement for a few days.  Please Note:  You might notice some irritation and congestion in your nose or some drainage.  This is from the oxygen used during your procedure.  There is no need for concern and it should clear up in a day or so.  SYMPTOMS TO REPORT IMMEDIATELY:   Following lower endoscopy (colonoscopy or flexible sigmoidoscopy):  Excessive amounts of blood in the stool  Significant tenderness or worsening of abdominal pains  Swelling of the abdomen that is new, acute  Fever of 100F or higher   For urgent or emergent issues, a gastroenterologist can be reached at any hour by calling [phone]. Do not use MyChart messaging for urgent concerns.    DIET:  We do recommend a small meal at first, but then you may proceed to your regular diet.  Drink plenty of fluids but you should avoid alcoholic beverages for 24  hours.  ACTIVITY:  You should plan to take it easy for the rest of today and you should NOT DRIVE or use heavy machinery until tomorrow (because of the sedation medicines used during the test).    FOLLOW UP: Our staff will call the number listed on your records 48-72 hours following your procedure to check on you and address any questions or concerns that you may have regarding the information given to you following your procedure. If we do not reach you, we will leave a message.  We will attempt to reach you two times.  During this call, we will ask if you have developed any symptoms of COVID 19. If you develop any symptoms (ie: fever, flu-like symptoms, shortness of breath, cough etc.) before then, please call [phone].  If you test positive for Covid 19 in the 2 weeks post procedure, please call and report this information to [place].    If any biopsies were taken you will be contacted by phone or by letter within the next 1-3 weeks.  Please call us at [phone] if you have not heard about the biopsies in 3 weeks.    SIGNATURES/CONFIDENTIALITY: You and/or your care partner have signed paperwork which will be entered into your electronic medical record.  These signatures attest to the fact that that the information above on your After Visit Summary has been reviewed and is understood.  Full responsibility of the confidentiality of this  discharge information lies with you and/or your care-partner.

## 2020-01-30 NOTE — Progress Notes (Signed)
Called to room to assist during endoscopic procedure.  Patient ID and intended procedure confirmed with present staff. Received instructions for my participation in the procedure from the performing physician.  

## 2020-01-31 ENCOUNTER — Telehealth: Payer: Self-pay | Admitting: Internal Medicine

## 2020-01-31 NOTE — Telephone Encounter (Signed)
Pt calling for lab results.

## 2020-01-31 NOTE — Telephone Encounter (Signed)
See result note which I will also send to the patient

## 2020-01-31 NOTE — Telephone Encounter (Signed)
Pt requested a call to discuss lab results.

## 2020-02-01 ENCOUNTER — Telehealth: Payer: Self-pay

## 2020-02-01 LAB — HEPATITIS C ANTIBODY
Hepatitis C Ab: NONREACTIVE
SIGNAL TO CUT-OFF: 0.04 (ref <1.00)

## 2020-02-01 LAB — QUANTIFERON-TB GOLD PLUS
Mitogen-NIL: >10 IU/mL
NIL: 0.04 IU/mL
QuantiFERON-TB Gold Plus: NEGATIVE
TB1-NIL: 0.01 IU/mL
TB2-NIL: <0 IU/mL

## 2020-02-01 LAB — HEPATITIS A ANTIBODY, TOTAL: Hepatitis A AB,Total: REACTIVE — AB

## 2020-02-01 LAB — HEPATITIS B CORE ANTIBODY, TOTAL: Hep B Core Total Ab: NONREACTIVE

## 2020-02-01 LAB — HEPATITIS B SURFACE ANTIBODY,QUALITATIVE: Hep B S Ab: NONREACTIVE

## 2020-02-01 LAB — HEPATITIS B SURFACE ANTIGEN: Hepatitis B Surface Ag: NONREACTIVE

## 2020-02-01 NOTE — Telephone Encounter (Signed)
Left message on follow up call. 

## 2020-02-01 NOTE — Telephone Encounter (Signed)
First post procedure follow up call, left message.

## 2020-02-12 ENCOUNTER — Encounter: Payer: Self-pay | Admitting: *Deleted

## 2020-02-13 ENCOUNTER — Telehealth: Payer: Self-pay | Admitting: Physician Assistant

## 2020-02-13 NOTE — Telephone Encounter (Signed)
[facility] [phone] called stated patients Humira is approved and they are requesting the script to be sent in

## 2020-02-13 NOTE — Telephone Encounter (Signed)
Pt is seeing Dr. Norman Herrlich 02/21/20 and will decide at that time if he wishes to proceed with starting the Humira. Script will be sent in after pt is seen. Tried to call phone number below in message and got a unidentified voicemail, no message left.

## 2020-02-14 ENCOUNTER — Encounter: Payer: Self-pay | Admitting: Internal Medicine

## 2020-02-21 ENCOUNTER — Ambulatory Visit (INDEPENDENT_AMBULATORY_CARE_PROVIDER_SITE_OTHER): Payer: 59 | Admitting: Internal Medicine

## 2020-02-21 ENCOUNTER — Encounter: Payer: Self-pay | Admitting: Internal Medicine

## 2020-02-21 VITALS — BP 112/72 | HR 75 | Ht 71.0 in | Wt 146.2 lb

## 2020-02-21 DIAGNOSIS — Z79899 Other long term (current) drug therapy: Secondary | ICD-10-CM | POA: Diagnosis not present

## 2020-02-21 DIAGNOSIS — K50113 Crohn's disease of large intestine with fistula: Secondary | ICD-10-CM

## 2020-02-21 DIAGNOSIS — Z23 Encounter for immunization: Secondary | ICD-10-CM

## 2020-02-21 DIAGNOSIS — K50819 Crohn's disease of both small and large intestine with unspecified complications: Secondary | ICD-10-CM

## 2020-02-21 NOTE — Progress Notes (Signed)
Subjective:    Patient ID: Carlos Jackson, male    DOB: 1995-09-19, 24 y.o.   MRN: [id number]  HPI Carlos Jackson is a 24 year old male with a recent diagnosis of Crohn's disease involving the ileum, colon and perianal skin with fistula who is seen in follow-up.  He is here today with his wife.  I performed a colonoscopy on 01/30/2020 to evaluate abdominal pain, rectal bleeding and abnormal MRI of the pelvis showing perianal fistula.  Colonoscopy revealed moderate inflammation in the distal ileum and at the IC valve.  The ascending colon and cecum were normal.  There was moderate colitis with erosions and ulcerations spread throughout the transverse, descending colon and sigmoid colon.  This was patchy in nature.  There was also inflammation in the distal rectum and anal canal.  There was a 7 mm polypoid lesion in the distal rectum which was removed by snare.  Pathology results of the ileum showed moderately active chronic ileitis.  In the ascending and active chronic colitis with granuloma.  Transverse mild active chronic colitis with granulomas.  Sigmoid and descending focally active chronic colitis with granulomas.  Rectal polyp was inflammatory.  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  No dysplasia was found.  He has been taking metronidazole 250 mg 3 times daily for the last approximate month.  He has about 2 more weeks of therapy left.  He is also intermittently using hydrocortisone rectal cream for perianal irritation and itching.  His symptoms overall have improved.  He has having intermittent episodes of low-grade fever, mouth sores and fatigue.  He is not having bowel symptoms with the metronidazole.  His bowel movements have been consistent 1-2 times per day mostly formed.  No blood in stool recently.  No further rectal pain or anal pain.  He is still having some fecal seepage but much less than prior to antibiotics.  He is not  having to wear pads in his underwear any longer.  He has thought about and researched Crohn's disease on line and discussed with his wife.  We have discussed Crohn's therapy and he wishes to speak being more about that today.   Review of Systems As per HPI, otherwise negative  Current Medications, Allergies, Past Medical History, Past Surgical History, Family History and Social History were reviewed in [employer] record.     Objective:   Physical Exam BP 112/72   Pulse 75   Ht _0  (1.803 m)   Wt 146 lb 3.2 oz (66.3 kg)   SpO2 99%   BMI 20.39 kg/m  Gen: awake, alert, NAD Neuro: nonfocal  CMP     Component Value Date/Time   NA 142 01/25/2020 0945   K 4.2 01/25/2020 0945   CL 105 01/25/2020 0945   CO2 28 01/25/2020 0945   GLUCOSE 92 01/25/2020 0945   BUN 20 01/25/2020 0945   CREATININE 0.97 01/25/2020 0945   CALCIUM 9.0 01/25/2020 0945   PROT 7.5 01/05/2020 1049   ALBUMIN 4.1 01/05/2020 1049   AST 13 01/05/2020 1049   ALT 11 01/05/2020 1049   ALKPHOS 75 01/05/2020 1049   BILITOT 0.3 01/05/2020 1049   GFRNONAA >60 01/25/2020 0945   GFRAA >60 01/25/2020 0945   CBC    Component Value Date/Time   WBC 5.8 01/25/2020 0945   RBC 5.25 01/25/2020 0945   HGB 14.2 01/25/2020 0945   HCT 44.7 01/25/2020 0945   PLT 253 01/25/2020 0945  MCV 85.1 01/25/2020 0945   MCH 27.0 01/25/2020 0945   MCHC 31.8 01/25/2020 0945   RDW 13.3 01/25/2020 0945   LYMPHSABS 1.3 01/05/2020 1049   MONOABS 0.8 01/05/2020 1049   EOSABS 0.3 01/05/2020 1049   BASOSABS 0.1 01/05/2020 1049   Hepatitis A antibody positive, hepatitis B negative but antibody negative as well, hep C antibody negative, QuantiFERON gold negative, prior ESR elevated at 34, CRP was less than 1 3 weeks ago      Assessment & Plan:  24 year old male with a recent diagnosis of Crohn's disease involving the ileum, colon and perianal skin with fistula who is seen in follow-up.   1.  Crohn's disease  involving the ileum, colon and perianal skin with fistula (diagnosis June 2021) --we spent considerable time today discussing his Crohn's disease the natural history, the medical management.  We discussed diet and how a plant-based diet with less processed foods would very likely benefit him overall.  We did discuss that there is no randomized data currently available to support a specific diet for IBD.  We spent the majority of time today discussing therapies touching briefly on 5-ASA therapies and how they are not very effective at Crohn's disease and certainly not Crohn's disease with fistula.  We discussed immunomodulator therapy and biologic therapy.  We spent time today discussing both Humira and Entyvio.  We discussed the risks associated with these medications as well as the benefits.  We discussed how biologic therapy has been proven to treat Crohn's disease with a very reasonable expectation of remission.  Anti-TNF therapy more than Entyvio would be expected to heal and prevent fistulizing Crohn's disease. We also discussed the risks of biologic therapy and immunomodulator therapy in great detail including the risk of infection (including reactivation of latent TB and underlying viral hepatitis), hepatotoxicity, leukopenia, pancreatitis, rash, theoretical risk of PML in the case of Entyvio, nausea, malignancy (specifically lymphoma and skin cancer), demyelinating disease, and even heart failure. --He will think further about this and discussed with his wife though he is leaning towards Humira therapy.  If we start Humira we would start with standard induction followed by 40 mg every 14 days.  He would like to use the [employer] nurse program --I advised him we discussed vaccination today; we will start Heplisav #1 today, I recommended Pneumovax 13 but he will think more about this.  He was vaccinated for chickenpox and so would not need shingles vaccination.  He is thinking about COVID-19  vaccination --I did recommend that he establish care with dermatology and see them regularly for skin checks --22-monthfollow-up with me --He will let me know soon regarding which biologic therapy he wishes to proceed  45 minutes total spent today including patient facing time, coordination of care, reviewing medical history/procedures/pertinent radiology studies, and documentation of the encounter.

## 2020-02-21 NOTE — Patient Instructions (Addendum)
Please follow up with Dr Hilarie Fredrickson in 3 months. _______________________________________________________________ Your provider has recommended Pneumovax 13. Per your request, we did not administer this vaccine today. ________________________________________________________________ We have given you the Heplisav (hepatitis B) vaccine today. You are scheduled for your second (final) vaccine on Monday, 03/25/20 at 9:30 am. _______________________________________________________________ If you are age 69 or older, your body mass index should be between 23-30. Your Body mass index is 20.39 kg/m. If this is out of the aforementioned range listed, please consider follow up with your Primary Care Provider.  If you are age 71 or younger, your body mass index should be between 19-25. Your Body mass index is 20.39 kg/m. If this is out of the aformentioned range listed, please consider follow up with your Primary Care Provider.   Due to recent changes in healthcare laws, you may see the results of your imaging and laboratory studies on MyChart before your provider has had a chance to review them.  We understand that in some cases there may be results that are confusing or concerning to you. Not all laboratory results come back in the same time frame and the provider may be waiting for multiple results in order to interpret others.  Please give [place] 48 hours in order for your provider to thoroughly review all the results before contacting the office for clarification of your results.

## 2020-02-22 ENCOUNTER — Other Ambulatory Visit: Payer: Self-pay

## 2020-02-22 MED ORDER — HUMIRA (2 PEN) 40 MG/0.4ML ~~LOC~~ AJKT: 40.0000 mg | AUTO-INJECTOR | SUBCUTANEOUS | 11 refills | Status: DC

## 2020-02-22 MED ORDER — HUMIRA-CD/UC/HS STARTER 80 MG/0.8ML ~~LOC~~ AJKT: AUTO-INJECTOR | SUBCUTANEOUS | 0 refills | Status: DC

## 2020-03-12 ENCOUNTER — Ambulatory Visit: Payer: 59 | Admitting: Internal Medicine

## 2020-03-25 ENCOUNTER — Ambulatory Visit (INDEPENDENT_AMBULATORY_CARE_PROVIDER_SITE_OTHER): Payer: 59 | Admitting: Internal Medicine

## 2020-03-25 DIAGNOSIS — Z23 Encounter for immunization: Secondary | ICD-10-CM

## 2020-04-18 ENCOUNTER — Other Ambulatory Visit: Payer: Self-pay

## 2020-04-18 MED ORDER — METRONIDAZOLE 250 MG PO TABS
250.0000 mg | ORAL_TABLET | Freq: Three times a day (TID) | ORAL | 0 refills | Status: DC
Start: 2020-04-18 — End: 2020-10-01

## 2020-04-18 MED ORDER — PANTOPRAZOLE SODIUM 40 MG PO TBEC
40.0000 mg | DELAYED_RELEASE_TABLET | Freq: Every day | ORAL | 3 refills | Status: DC
Start: 2020-04-18 — End: 2020-10-01

## 2020-04-18 NOTE — Telephone Encounter (Signed)
Please let patient know that I received his email through my chart  I am sorry to hear that he is still having some Crohn's symptoms.  I think it is too early to have complete response to Humira I think he should continue 40 mg Humira every 14 days and I would think symptoms will improve over the next 4 to 6 weeks.  I would ask him to consider starting pantoprazole 40 mg daily to help control GERD symptoms.  We can have him on this for 1 to 2 months to see if GERD settles down.  This medication has very little side effect when used for short periods  Also would resume metronidazole 250 mg 3 times daily x7 to 10 days for perianal Crohn's with concern for abscess  Have him update me on how he is doing in 7 to 14 days, sooner if symptoms worsening

## 2020-08-29 NOTE — Telephone Encounter (Signed)
Please let Carlos Jackson know I received his email I am glad to hear that he is feeling better. He has made dietary modifications which also seem to be helping him He reports pain swelling and itching at injection site after Humira Please asked that he take a picture of this after his next dose and send it to me Also have him come for CBC, CMP, CRP, adalimumab level and antibody on the day before his next Humira injection  He should be scheduled for an office visit with me to, next available

## 2020-08-30 ENCOUNTER — Other Ambulatory Visit: Payer: Self-pay

## 2020-08-30 DIAGNOSIS — K50819 Crohn's disease of both small and large intestine with unspecified complications: Secondary | ICD-10-CM

## 2020-09-03 NOTE — Telephone Encounter (Signed)
That does appear to be a local, mild, reaction to the Humira injecton Let's see what his level and antibody result it As long as the reaction is local, to the injection site only and not spreading or involving diffuse itching or swelling, he can continue therapy until I see his lab results. I would also recommend he take diphenhydramine 25 mg 1 hour before each injection going forward. This will likely minimize allergic response

## 2020-09-16 ENCOUNTER — Other Ambulatory Visit (INDEPENDENT_AMBULATORY_CARE_PROVIDER_SITE_OTHER): Payer: 59

## 2020-09-16 DIAGNOSIS — K50819 Crohn's disease of both small and large intestine with unspecified complications: Secondary | ICD-10-CM | POA: Diagnosis not present

## 2020-09-16 LAB — COMPREHENSIVE METABOLIC PANEL
ALT: 22 U/L (ref 0–53)
AST: 17 U/L (ref 0–37)
Albumin: 4.7 g/dL (ref 3.5–5.2)
Alkaline Phosphatase: 75 U/L (ref 39–117)
BUN: 15 mg/dL (ref 6–23)
CO2: 30 mEq/L (ref 19–32)
Calcium: 9.6 mg/dL (ref 8.4–10.5)
Chloride: 103 mEq/L (ref 96–112)
Creatinine, Ser: 0.92 mg/dL (ref 0.40–1.50)
GFR: 116.62 mL/min (ref >60.00)
Glucose, Bld: 90 mg/dL (ref 70–99)
Potassium: 3.9 mEq/L (ref 3.5–5.1)
Sodium: 138 mEq/L (ref 135–145)
Total Bilirubin: 0.4 mg/dL (ref 0.2–1.2)
Total Protein: 7.9 g/dL (ref 6.0–8.3)

## 2020-09-16 LAB — CBC WITH DIFFERENTIAL/PLATELET
Basophils Absolute: 0.1 10*3/uL (ref 0.0–0.1)
Basophils Relative: 1.2 % (ref 0.0–3.0)
Eosinophils Absolute: 0.2 10*3/uL (ref 0.0–0.7)
Eosinophils Relative: 3.9 % (ref 0.0–5.0)
HCT: 44 % (ref 39.0–52.0)
Hemoglobin: 15.3 g/dL (ref 13.0–17.0)
Lymphocytes Relative: 22.5 % (ref 12.0–46.0)
Lymphs Abs: 1.2 10*3/uL (ref 0.7–4.0)
MCHC: 34.7 g/dL (ref 30.0–36.0)
MCV: 84 fl (ref 78.0–100.0)
Monocytes Absolute: 0.5 10*3/uL (ref 0.1–1.0)
Monocytes Relative: 9.2 % (ref 3.0–12.0)
Neutro Abs: 3.4 10*3/uL (ref 1.4–7.7)
Neutrophils Relative %: 63.2 % (ref 43.0–77.0)
Platelets: 151 10*3/uL (ref 150.0–400.0)
RBC: 5.24 Mil/uL (ref 4.22–5.81)
RDW: 13.3 % (ref 11.5–15.5)
WBC: 5.4 10*3/uL (ref 4.0–10.5)

## 2020-09-16 LAB — HIGH SENSITIVITY CRP: CRP, High Sensitivity: 0.63 mg/L (ref 0.000–5.000)

## 2020-09-22 LAB — SERIAL MONITORING

## 2020-09-23 ENCOUNTER — Encounter: Payer: Self-pay | Admitting: *Deleted

## 2020-09-23 LAB — ADALIMUMAB+AB (SERIAL MONITOR)
Adalimumab Drug Level: <0.6 ug/mL
Anti-Adalimumab Antibody: 2410 ng/mL

## 2020-10-01 ENCOUNTER — Encounter: Payer: Self-pay | Admitting: Internal Medicine

## 2020-10-01 ENCOUNTER — Ambulatory Visit (INDEPENDENT_AMBULATORY_CARE_PROVIDER_SITE_OTHER): Payer: 59 | Admitting: Internal Medicine

## 2020-10-01 VITALS — BP 90/58 | HR 84 | Ht 71.0 in | Wt 138.0 lb

## 2020-10-01 DIAGNOSIS — K50813 Crohn's disease of both small and large intestine with fistula: Secondary | ICD-10-CM | POA: Diagnosis not present

## 2020-10-01 DIAGNOSIS — R5383 Other fatigue: Secondary | ICD-10-CM

## 2020-10-01 DIAGNOSIS — K50113 Crohn's disease of large intestine with fistula: Secondary | ICD-10-CM | POA: Diagnosis not present

## 2020-10-01 NOTE — Patient Instructions (Signed)
Your provider has requested that you go to the basement level for lab work before leaving today. Press "B" on the elevator. The lab is located at the first door on the left as you exit the elevator.  Come tomorrow for labwork as well (between 8 am-10 am).  Please follow up with Dr Hilarie Fredrickson in 3 months.  Discontinue Humira.  If you are age 25 or older, your body mass index should be between 23-30. Your Body mass index is 19.25 kg/m. If this is out of the aforementioned range listed, please consider follow up with your Primary Care Provider.  If you are age 6 or younger, your body mass index should be between 19-25. Your Body mass index is 19.25 kg/m. If this is out of the aformentioned range listed, please consider follow up with your Primary Care Provider.   Due to recent changes in healthcare laws, you may see the results of your imaging and laboratory studies on MyChart before your provider has had a chance to review them.  We understand that in some cases there may be results that are confusing or concerning to you. Not all laboratory results come back in the same time frame and the provider may be waiting for multiple results in order to interpret others.  Please give [place] 48 hours in order for your provider to thoroughly review all the results before contacting the office for clarification of your results.

## 2020-10-01 NOTE — Progress Notes (Signed)
Subjective:    Patient ID: Carlos Jackson, male    DOB: 20-Feb-1996, 25 y.o.   MRN: [id number]  HPI Carlos Jackson is a 25 year old male with a history of ileocolonic Crohn's disease plus perianal Crohn's disease with fistula (diagnosis June 2021) treated with Humira who is here for follow-up.  He was last seen in the office on 02/21/2020.  He is here alone today.  It was recently discovered that he has developed antibodies to Humira and his Humira level was undetectable.  He reports that since being seen here he along with his Humira 40 mg every 14 days started an elimination diet and eating in a much more "clean" fashion.  He saw a nutritionist, Dr. Tessa Lerner, at [facility] and was diagnosed with overgrowth.  With his dietary changes he initially ate only chicken, fish, fruits and vegetables.  He had resolution of his oral ulcerations, reflux symptoms, abdominal pain, and his bowel movements normalized.  He also had periods where his energy levels felt more normal.  He also started a pre- and probiotic as well as a medication called "attack".  He slowly added back and dairy which seems to be fine, gluten which seemed to worsen his reflux and GERD symptoms and then eventually beef and pork.  When he added back beef and pork he felt more "inflammation" in his oral sores returned.  He has since avoided these in the oral ulcers have resolved again.  He does continue to feel fatigue and at times brain fog and almost blurry vision.  He has not had diarrhea, blood in stool or melena.  He has had some mild occasional perianal discomfort but no further drainage, leakage or perianal lumps or bumps.  He has concerns about future Crohn's therapies and wonders if his disease can be controlled with diet modifications given his improvements as above.   Review of Systems As per HPI, otherwise negative  Current Medications, Allergies, Past Medical History, Past Surgical History, Family History and Social  History were reviewed in [employer] record.     Objective:   Physical Exam BP (!) 90/58   Pulse 84   Ht 5' 11"  (1.803 m)   Wt 138 lb (62.6 kg)   SpO2 99%   BMI 19.25 kg/m  Gen: awake, alert, NAD HEENT: anicteric CV: RRR, no mrg Pulm: CTA b/l Abd: soft, NT/ND, +BS throughout Ext: no c/c/e Neuro: nonfocal  CBC    Component Value Date/Time   WBC 5.4 09/16/2020 1640   RBC 5.24 09/16/2020 1640   HGB 15.3 09/16/2020 1640   HCT 44.0 09/16/2020 1640   PLT 151.0 09/16/2020 1640   MCV 84.0 09/16/2020 1640   MCH 27.0 01/25/2020 0945   MCHC 34.7 09/16/2020 1640   RDW 13.3 09/16/2020 1640   LYMPHSABS 1.2 09/16/2020 1640   MONOABS 0.5 09/16/2020 1640   EOSABS 0.2 09/16/2020 1640   BASOSABS 0.1 09/16/2020 1640   CMP     Component Value Date/Time   NA 138 09/16/2020 1640   K 3.9 09/16/2020 1640   CL 103 09/16/2020 1640   CO2 30 09/16/2020 1640   GLUCOSE 90 09/16/2020 1640   BUN 15 09/16/2020 1640   CREATININE 0.92 09/16/2020 1640   CALCIUM 9.6 09/16/2020 1640   PROT 7.9 09/16/2020 1640   ALBUMIN 4.7 09/16/2020 1640   AST 17 09/16/2020 1640   ALT 22 09/16/2020 1640   ALKPHOS 75 09/16/2020 1640   BILITOT 0.4 09/16/2020 1640  GFRNONAA >60 01/25/2020 0945   GFRAA >60 01/25/2020 0945   hsCRP 0.630  Adalimumab level < 0.6, Ab = 2410     Assessment & Plan:  25 year old male with a history of ileocolonic Crohn's disease plus perianal Crohn's disease with fistula (diagnosis June 2021) treated with Humira who is here for follow-up.   1. Ileocolonic Crohn's with perianal involvement and history of perianal fistula (diagnosis June 2021) --clinically he has improved and we now know that he has developed antibodies to Humira with no detectable drug level rendering this drug completely ineffective.  We spent considerable time today discussing his current treatment course.  It appears that he has reached clinical remission.  We discussed the difference  between clinical, endoscopic, and histologic remission.  He has made dramatic changes in his diet and feels that some of the improvement is related to diet change rather than simply Humira/medical therapy.  He may be somewhat right on this point, but I do think that medical therapy is also responsible for some of the improvement.  We discussed other options for therapy for his Crohn's disease which would include infliximab or Entyvio.  Given the antibodies that formed rather quickly to Humira, we also discussed using low-dose azathioprine to help prevent antibody formation.  We did discuss some concern regarding immunomodulator therapy and the risk of lymphoma.  Lymphoma risk is very small and this condition is quite rare however being a young male this was discussed today at length.  After thorough discussion we will try to objectively assess disease activity first with fecal calprotectin and then possibly with repeat colonoscopy.  If fecal calprotectin is elevated or colonoscopy shows persistent ileal colonic Crohn's disease and medical therapy is definitely recommended.  My preference would be to change to a new biologic now but he would like to consider this further.  His history of fistula/penetrating disease makes his Crohn's higher risk for future complications.  --Fecal calprotectin --Follow-up with me in 3 months --Continue current dietary modifications with avoiding beef, pork and gluten --Consider new therapy based on the above with either infliximab, Entyvio or Stelara  2.  Fatigue --very likely related to #1, but cannot exclude overlapping issue.  I recommended the following labs: --B12, ferritin, ESR, TSH and free testosterone.  He will return between 8 and 10 AM to have these labs drawn later this week.  50 minutes total spent today including patient facing time, coordination of care, reviewing medical history/procedures/pertinent radiology studies, and documentation of the  encounter.

## 2020-10-02 ENCOUNTER — Other Ambulatory Visit (INDEPENDENT_AMBULATORY_CARE_PROVIDER_SITE_OTHER): Payer: 59

## 2020-10-02 DIAGNOSIS — K50813 Crohn's disease of both small and large intestine with fistula: Secondary | ICD-10-CM

## 2020-10-02 LAB — TSH: TSH: 1.06 u[IU]/mL (ref 0.35–4.50)

## 2020-10-02 LAB — SEDIMENTATION RATE: Sed Rate: 7 mm/hr (ref 0–15)

## 2020-10-02 LAB — TESTOSTERONE: Testosterone: 377.61 ng/dL (ref 300.00–890.00)

## 2020-10-02 LAB — VITAMIN B12: Vitamin B-12: 226 pg/mL (ref 211–911)

## 2020-10-02 LAB — FERRITIN: Ferritin: 34.3 ng/mL (ref 22.0–322.0)

## 2020-10-07 ENCOUNTER — Other Ambulatory Visit: Payer: 59

## 2020-10-07 DIAGNOSIS — K50813 Crohn's disease of both small and large intestine with fistula: Secondary | ICD-10-CM

## 2020-10-13 LAB — CALPROTECTIN: Calprotectin: 73 mcg/g

## 2021-02-14 ENCOUNTER — Telehealth: Payer: Self-pay | Admitting: Internal Medicine

## 2021-02-14 NOTE — Telephone Encounter (Signed)
Inbound call from pt's wife requesting a call back stating that her husband has a fever between 99 and 101, sore throat, pain while swallowing. He took a Covid test and it was negative, however he had an rectal abscess last summer and he is having pain in the same area as of now. Please advise. Thanks.

## 2021-02-14 NOTE — Telephone Encounter (Signed)
Pts wife states that the pt has crohn's and that in the past he would run a low grade temp and have throat discomfort then he had issues with his rectal fistula/abscess last summer. Reports he started having a temp low grade 99-100 this am and his throat is sore-could be sinus drainage but he did a covid test and it was negative. Pt reports the area where the abscess was is tender and a little painful. He has not checked to see if there is any redness. They wanted to let Dr. Hilarie Fredrickson know and see if he had any recommendations. Please advise.

## 2021-02-15 ENCOUNTER — Other Ambulatory Visit: Payer: Self-pay

## 2021-02-15 ENCOUNTER — Telehealth: Payer: Self-pay | Admitting: Physician Assistant

## 2021-02-15 ENCOUNTER — Ambulatory Visit (HOSPITAL_COMMUNITY)
Admission: EM | Admit: 2021-02-15 | Discharge: 2021-02-15 | Disposition: A | Discharge status: Home / Self Care | Payer: 59 | Attending: Family Medicine | Admitting: Family Medicine

## 2021-02-15 ENCOUNTER — Encounter (HOSPITAL_COMMUNITY): Payer: Self-pay | Admitting: Emergency Medicine

## 2021-02-15 DIAGNOSIS — U071 COVID-19: Secondary | ICD-10-CM

## 2021-02-15 DIAGNOSIS — R509 Fever, unspecified: Secondary | ICD-10-CM | POA: Diagnosis present

## 2021-02-15 DIAGNOSIS — Z8616 Personal history of COVID-19: Secondary | ICD-10-CM | POA: Diagnosis not present

## 2021-02-15 DIAGNOSIS — Z28310 Unvaccinated for covid-19: Secondary | ICD-10-CM | POA: Diagnosis not present

## 2021-02-15 DIAGNOSIS — J069 Acute upper respiratory infection, unspecified: Secondary | ICD-10-CM | POA: Diagnosis present

## 2021-02-15 HISTORY — DX: COVID-19: U07.1

## 2021-02-15 LAB — POC INFLUENZA A AND B ANTIGEN (URGENT CARE ONLY)
INFLUENZA A ANTIGEN, POC: NEGATIVE
INFLUENZA B ANTIGEN, POC: NEGATIVE

## 2021-02-15 MED ORDER — IBUPROFEN 800 MG PO TABS
ORAL_TABLET | ORAL | Status: AC
  Filled 2021-02-15: qty 1

## 2021-02-15 MED ORDER — AMOXICILLIN-POT CLAVULANATE 875-125 MG PO TABS: 1.0000 | ORAL_TABLET | Freq: Two times a day (BID) | ORAL | 0 refills | Status: AC

## 2021-02-15 MED ORDER — IBUPROFEN 800 MG PO TABS
800.0000 mg | ORAL_TABLET | Freq: Once | ORAL | Status: AC
  Administered 2021-02-15: 800 mg via ORAL

## 2021-02-15 NOTE — ED Triage Notes (Signed)
Pt is present with fever, body aches, chills, nasal congestion, and slight cough. Pt states that his sx started Thursday night. Pt states that he took tylenol around 10am

## 2021-02-15 NOTE — Telephone Encounter (Signed)
Telephone call: 02/15/2021 11:50 AM  Patient's wife called on-call service this weekend, she had apparently called our clinic yesterday as well, but this was toward the end of the day and never got a phone call back.    Her husband, history of Crohn's with abscess, is experiencing fever temperature up to 101.3, apparently yesterday able to keep this down with analgesics but today it is still high.  Also does have some sinus drainage and sore throat, tested him for COVID which was negative at home.  Also now experiencing rectal discomfort in the area of his previous abscess.  She is unsure if this is a flare or what is going on.  Tells me often he will have the same symptoms when he has a flare of Crohn's.  Discussed with the patient's wife that he likely needs to come in to the ER so that this does not get any worse and can be evaluated further.  She verbalized understanding.  Explained that when they get to the hospital if we are needed they will call our service.  Ellouise Newer, PA-C  FYI Dr. Silverio Decamp.

## 2021-02-15 NOTE — ED Provider Notes (Signed)
[place]   [id number] 02/15/21 Arrival Time: 70   CC: COVID symptoms  SUBJECTIVE: History from: patient.  Carlos Jackson is a 25 y.o. male who presents with fever, body aches, chills, nasal congestion, cough for the last 3 days. Denies sick exposure to COVID, flu or strep. Denies recent travel. Has positive history of Covid. Has not completed Covid vaccines. Has not completed flu vaccine. Has taken tylenol for this. There are no aggravating or alleviating factors. Denies previous symptoms in the past. Denies sinus pain, rhinorrhea, sore throat, SOB, wheezing, chest pain, nausea, changes in bowel or bladder habits.    ROS: As per HPI.  All other pertinent ROS negative.     Past Medical History:  Diagnosis Date   Anal fissure 2014   Colitis    Crohn's disease ([place])    Ileitis    Mouth ulcers    Perianal fistula    Perirectal abscess    Past Surgical History:  Procedure Laterality Date   COLONOSCOPY     NO PAST SURGERIES     No Active Allergies No current facility-administered medications on file prior to encounter.   Current Outpatient Medications on File Prior to Encounter  Medication Sig Dispense Refill   hydrocortisone (ANUSOL-HC) 2.5 % rectal cream Place 1 application rectally 2 (two) times daily. (Patient taking differently: Place 1 application rectally as needed.) 30 g 1   PREBIOTIC PRODUCT PO Take 2 Scoops by mouth 2 (two) times daily.     Probiotic Product (PROBIOTIC-10 PO) Take 2 Scoops by mouth 2 (two) times daily.     Social History   Socioeconomic History   Marital status: Married    Spouse name: Not on file   Number of children: 0   Years of education: Not on file   Highest education level: Not on file  Occupational History   Occupation: [employer]  Tobacco Use   Smoking status: Never   Smokeless tobacco: Never  Vaping Use   Vaping Use: Never used  Substance and Sexual Activity   Alcohol use: Not Currently   Drug use: Never    Sexual activity: Not on file  Other Topics Concern   Not on file  Social History Narrative   Not on file   Social Determinants of Health   Financial Resource Strain: Not on file  Food Insecurity: Not on file  Transportation Needs: Not on file  Physical Activity: Not on file  Stress: Not on file  Social Connections: Not on file  Intimate Partner Violence: Not on file   Family History  Problem Relation Age of Onset   Depression Mother    Anxiety disorder Mother    OCD Mother    Breast cancer Maternal Grandmother    Hypertension Maternal Grandfather    Heart block Maternal Grandfather    Crohn's disease Cousin    Colon cancer Neg Hx    Esophageal cancer Neg Hx    Stomach cancer Neg Hx     OBJECTIVE:  Vitals:   02/15/21 1417  BP: (!) 136/93  Pulse: 92  Resp: 17  Temp: (!) 101.6 F (38.7 C)  TempSrc: Oral  SpO2: 95%     General appearance: alert; appears fatigued, but nontoxic; speaking in full sentences and tolerating own secretions HEENT: NCAT; Ears: EACs clear, TMs pearly gray; Eyes: PERRL.  EOM grossly intact. Sinuses: nontender; Nose: nares patent with clear rhinorrhea, Throat: oropharynx erythematous, cobblestoning present, tonsils non erythematous or enlarged, uvula midline  Neck: supple  with LAD Lungs: unlabored respirations, symmetrical air entry; cough: absent; no respiratory distress; CTAB Heart: regular rate and rhythm.  Radial pulses 2+ symmetrical bilaterally Skin: warm and dry Psychological: alert and cooperative; normal mood and affect  LABS:  No results found for this or any previous visit (from the past 24 hour(s)).   ASSESSMENT & PLAN:  1. Viral URI   2. Fever, unspecified fever cause     Meds ordered this encounter  Medications   ibuprofen (ADVIL) tablet 800 mg   amoxicillin-clavulanate (AUGMENTIN) 875-125 MG tablet    Sig: Take 1 tablet by mouth 2 (two) times daily for 7 days.    Dispense:  14 tablet    Refill:  0    Order Specific  Question:   Supervising Provider    Answer:   Chase Picket [id number]   Ibuprofen in office for fever Prescribed Augmentin 875 to cover secondary bacterial infection (sinus/URI) if fever is still over 100.5 with all negative viral testing Continue supportive care at home COVID and flu testing ordered.  It will take between 2-3 days for test results. Someone will contact you regarding abnormal results.   Work  note provided Patient should remain in quarantine until they have received Covid results.  If negative you may resume normal activities (go back to work/school) while practicing hand hygiene, social distance, and mask wearing.  If positive, patient should remain in quarantine for at least 5 days from symptom onset AND greater than 72 hours after symptoms resolution (absence of fever without the use of fever-reducing medication and improvement in respiratory symptoms), whichever is longer Get plenty of rest and push fluids Use OTC zyrtec for nasal congestion, runny nose, and/or sore throat Use OTC flonase for nasal congestion and runny nose Use medications daily for symptom relief Use OTC medications like ibuprofen or tylenol as needed fever or pain Call or go to the ED if you have any new or worsening symptoms such as fever, worsening cough, shortness of breath, chest tightness, chest pain, turning blue, changes in mental status.  Reviewed expectations re: course of current medical issues. Questions answered. Outlined signs and symptoms indicating need for more acute intervention. Patient verbalized understanding. After Visit Summary given.          Faustino Congress, NP 02/15/21 1442

## 2021-02-15 NOTE — Discharge Instructions (Addendum)
Your COVID and Influenza tests are pending.  You should self quarantine until the test results are back.    If flu is positive, I will send in tamiflu for you. This is one tablet twice a day for 5 days  If you still have a fever over 100.5 and negative viral testing, I have sent in Augmentin for you to take twice a day for 7 days  Take Tylenol or ibuprofen as needed for fever or discomfort.  Rest and keep yourself hydrated.    Follow-up with your primary care provider if your symptoms are not improving.

## 2021-02-16 LAB — SARS CORONAVIRUS 2 (TAT 6-24 HRS): SARS Coronavirus 2: POSITIVE — AB

## 2021-02-16 NOTE — Telephone Encounter (Signed)
Covid Positive, patient was discharged home from ER

## 2021-02-19 ENCOUNTER — Other Ambulatory Visit: Payer: Self-pay

## 2021-02-19 DIAGNOSIS — K50813 Crohn's disease of both small and large intestine with fistula: Secondary | ICD-10-CM

## 2021-02-19 MED ORDER — CIPROFLOXACIN HCL 500 MG PO TABS: 500.0000 mg | ORAL_TABLET | Freq: Two times a day (BID) | ORAL | 0 refills | Status: DC

## 2021-02-19 MED ORDER — PREDNISONE 10 MG PO TABS: ORAL_TABLET | ORAL | 0 refills | Status: AC

## 2021-02-19 MED ORDER — METRONIDAZOLE 250 MG PO TABS: 250.0000 mg | ORAL_TABLET | Freq: Three times a day (TID) | ORAL | 0 refills | Status: DC

## 2021-02-19 NOTE — Telephone Encounter (Signed)
Spoke with pt and he is aware of appt and knows to pick up the prescriptions. Pt states he was started on Augmentin for sinus issues and has 4 days left. Does he need to finish them and wait to start the cipro and flagyl or stop augmentin.  Please advise.

## 2021-02-19 NOTE — Telephone Encounter (Signed)
Can wait until he finishes the augmentin, only start cipro/metro if perianal symptoms not better JMP

## 2021-02-19 NOTE — Telephone Encounter (Signed)
Spoke with pt and he is aware.

## 2021-02-19 NOTE — Telephone Encounter (Signed)
It is very likely that his Crohn's has returned; he has a history of colonic Crohn's with perianal involvement and history of fistula He had been managed with Humira but developed antibodies I feel strongly that he will need Biologics and infliximab would be a good option for him; this is an infusion.  We talked about this at his last office visit In the interim we will need to treat with prednisone and antibiotics Please see if he is open to beginning infliximab therapy, risk profile same as with Humira  He needs CBC, CMP, CRP, hepatitis B surface antigen, surface antibody and hepatitis B core total antibody, QuantiFERON gold  Prednisone 40 mg daily x7 days, decrease by 10 mg every 7 days --this will be temporizing only and should improve symptoms but we need to start more definitive therapy with Biologics to achieve remission Cipro 500 mg twice daily and metronidazole 250 mg 3 times daily both for 10 days Call if not improving or worsening  I would like to see him in the office, next available, add to cancellation list JMP

## 2021-02-19 NOTE — Telephone Encounter (Signed)
Inbound call from patient. States he tested positive 7/2. Now believes he is having Crohns flare: mouth sore, diarrhea, abd and rectal pain. States there is nothing can eat to make feel better. Would like a call back to discuss [phone]

## 2021-02-19 NOTE — Telephone Encounter (Signed)
Pt states he tested positive for covid on 7/2. Reports he now thinks he is having a crohns flare. He has mouth sores, diarrhea, abdominal pain, rectal pain. He also report fatigue. No blood or mucous present in the stool. Pt thinks he is getting another abscess in the same spot as last time, reports there is scar tissue there but when he has a flare the area gets larger and irritated. Please advise.

## 2021-02-21 ENCOUNTER — Encounter: Payer: Self-pay | Admitting: *Deleted

## 2021-02-24 ENCOUNTER — Encounter: Payer: Self-pay | Admitting: Internal Medicine

## 2021-02-24 ENCOUNTER — Ambulatory Visit (INDEPENDENT_AMBULATORY_CARE_PROVIDER_SITE_OTHER): Payer: 59 | Admitting: Internal Medicine

## 2021-02-24 VITALS — BP 92/60 | HR 54 | Ht 71.0 in | Wt 142.0 lb

## 2021-02-24 DIAGNOSIS — K50813 Crohn's disease of both small and large intestine with fistula: Secondary | ICD-10-CM

## 2021-02-24 NOTE — Patient Instructions (Signed)
You have been scheduled for follow up with Dr Hilarie Fredrickson on 06/05/21 at 2:10 pm.  No need to start prednisone right now.  Your provider has requested that you go to the basement level for lab work before leaving today. Press "B" on the elevator. The lab is located at the first door on the left as you exit the elevator.  You may use the antibiotics you have on hand should you continue having rectal drainage/pain with the perirectal abscess.  If you are age 68 or younger, your body mass index should be between 19-25. Your Body mass index is 19.8 kg/m. If this is out of the aformentioned range listed, please consider follow up with your Primary Care Provider.   __________________________________________________________  The [facility] providers would like to encourage you to use [facility] to communicate with providers for non-urgent requests or questions.  Due to long hold times on the telephone, sending your provider a message by [facility] may be a faster and more efficient way to get a response.  Please allow 48 business hours for a response.  Please remember that this is for non-urgent requests.   Due to recent changes in healthcare laws, you may see the results of your imaging and laboratory studies on MyChart before your provider has had a chance to review them.  We understand that in some cases there may be results that are confusing or concerning to you. Not all laboratory results come back in the same time frame and the provider may be waiting for multiple results in order to interpret others.  Please give [place] 48 hours in order for your provider to thoroughly review all the results before contacting the office for clarification of your results.

## 2021-02-26 ENCOUNTER — Encounter: Payer: Self-pay | Admitting: Internal Medicine

## 2021-02-26 NOTE — Progress Notes (Signed)
Subjective:    Patient ID: Carlos Jackson, male    DOB: Nov 01, 1995, 25 y.o.   MRN: [id number]  HPI [place] Espina is a 25 year old male with a history of ileocolonic Crohn's disease with perianal involvement complicated by fistula (diagnosis June 2021) previously treated with Humira stopped after antibodies were discovered without drug level who is seen for follow-up.  He is here with his wife today.  He was last seen in the office on 10/01/2020.  At the time of his last visit he had reached clinical remission and the plan was that he would consider options for additional biologic therapy.  We checked a fecal calprotectin which was borderline and he was continuing his dietary modifications.  Recently around 02/14/2021 he developed viral symptoms and fever and tested positive for COVID-19.  During this time he also had recurrence of lower abdominal pain, rectal pain, and the scant drainage from his perianal skin.  He was concern for recurrent abscess.  He was treated with antibiotics by primary care/urgent care.  I have prescribed prednisone and Cipro/metronidazole but he has not started this to this point.  His COVID-19 symptoms have completely resolved.  Currently he is having mostly formed stools 1 to 2/day.  His loose stools have resolved.  He has not had any further rectal bleeding.  His rectal pain is still there but only for a few seconds a couple of times a day.  He is not having pain with defecation.  He is thought more about biologic medications but has a fear that if he develops antibodies quickly he may run out of future therapeutic options.  Review of Systems As per HPI, otherwise negative  Current Medications, Allergies, Past Medical History, Past Surgical History, Family History and Social History were reviewed in [employer] record.    Objective:   Physical Exam BP 92/60   Pulse (!) 54   Ht 5' 11"  (1.803 m)   Wt 142 lb (64.4 kg)   SpO2 99%   BMI 19.80 kg/m   Gen: awake, alert, NAD HEENT: anicteric CV: RRR, no mrg Pulm: CTA b/l Abd: soft, NT/ND, +BS throughout Ext: no c/c/e Neuro: nonfocal  CBC    Component Value Date/Time   WBC 5.4 09/16/2020 1640   RBC 5.24 09/16/2020 1640   HGB 15.3 09/16/2020 1640   HCT 44.0 09/16/2020 1640   PLT 151.0 09/16/2020 1640   MCV 84.0 09/16/2020 1640   MCH 27.0 01/25/2020 0945   MCHC 34.7 09/16/2020 1640   RDW 13.3 09/16/2020 1640   LYMPHSABS 1.2 09/16/2020 1640   MONOABS 0.5 09/16/2020 1640   EOSABS 0.2 09/16/2020 1640   BASOSABS 0.1 09/16/2020 1640   CMP     Component Value Date/Time   NA 138 09/16/2020 1640   K 3.9 09/16/2020 1640   CL 103 09/16/2020 1640   CO2 30 09/16/2020 1640   GLUCOSE 90 09/16/2020 1640   BUN 15 09/16/2020 1640   CREATININE 0.92 09/16/2020 1640   CALCIUM 9.6 09/16/2020 1640   PROT 7.9 09/16/2020 1640   ALBUMIN 4.7 09/16/2020 1640   AST 17 09/16/2020 1640   ALT 22 09/16/2020 1640   ALKPHOS 75 09/16/2020 1640   BILITOT 0.4 09/16/2020 1640   GFRNONAA >60 01/25/2020 0945   GFRAA >60 01/25/2020 0945        Assessment & Plan:  25 year old male with a history of ileocolonic Crohn's disease with perianal involvement complicated by fistula (diagnosis June 2021) previously treated with Humira stopped after  antibodies were discovered without drug level who is seen for follow-up.  Ileocolonic Crohn's with perianal involvement/history of perianal fistula (diagnosis June 2021) --he is getting over COVID-19 which for him this is his second infection with COVID-19.  He has had a flare of his Crohn's disease including recurrent perianal infection.  He does seem to be improving.  We discussed management today and I do feel that he would do better long-term on a biologic medication.  We discussed options including infliximab, Entyvio, and Stelara.  He will read more about these therapies.  We will try to obtain more objective information today as follows: --Fecal  calprotectin --CBC, CRP and ESR --We will hold off on treating with prednisone at present --If his perianal pain fails to resolve entirely or should he develop recurrent pain and drainage he should start the ciprofloxacin and metronidazole previously prescribed --He was directed to the ccfa.org website to read more about medical therapies discussed above. --office followup in 2-3 months, sooner if not improving  30 minutes total spent today including patient facing time, coordination of care, reviewing medical history/procedures/pertinent radiology studies, and documentation of the encounter.

## 2021-05-11 ENCOUNTER — Emergency Department: Payer: 59

## 2021-05-11 ENCOUNTER — Emergency Department
Admission: EM | Admit: 2021-05-11 | Discharge: 2021-05-11 | Disposition: A | Discharge status: Home / Self Care | Payer: 59 | Attending: Emergency Medicine | Admitting: Emergency Medicine

## 2021-05-11 ENCOUNTER — Other Ambulatory Visit: Payer: Self-pay

## 2021-05-11 DIAGNOSIS — Z8616 Personal history of COVID-19: Secondary | ICD-10-CM | POA: Diagnosis not present

## 2021-05-11 DIAGNOSIS — R0789 Other chest pain: Secondary | ICD-10-CM | POA: Insufficient documentation

## 2021-05-11 DIAGNOSIS — R079 Chest pain, unspecified: Secondary | ICD-10-CM

## 2021-05-11 LAB — CBC
HCT: 44.7 % (ref 39.0–52.0)
Hemoglobin: 15.8 g/dL (ref 13.0–17.0)
MCH: 30.8 pg (ref 26.0–34.0)
MCHC: 35.3 g/dL (ref 30.0–36.0)
MCV: 87.1 fL (ref 80.0–100.0)
Platelets: 162 10*3/uL (ref 150–400)
RBC: 5.13 MIL/uL (ref 4.22–5.81)
RDW: 12.2 % (ref 11.5–15.5)
WBC: 9 10*3/uL (ref 4.0–10.5)
nRBC: 0 % (ref 0.0–0.2)

## 2021-05-11 LAB — BASIC METABOLIC PANEL
Anion gap: 12 (ref 5–15)
BUN: 18 mg/dL (ref 6–20)
CO2: 27 mmol/L (ref 22–32)
Calcium: 9.6 mg/dL (ref 8.9–10.3)
Chloride: 102 mmol/L (ref 98–111)
Creatinine, Ser: 0.95 mg/dL (ref 0.61–1.24)
GFR, Estimated: >60 mL/min (ref >60)
Glucose, Bld: 99 mg/dL (ref 70–99)
Potassium: 4.3 mmol/L (ref 3.5–5.1)
Sodium: 141 mmol/L (ref 135–145)

## 2021-05-11 LAB — TROPONIN I (HIGH SENSITIVITY)
Troponin I (High Sensitivity): <2 ng/L (ref <18)
Troponin I (High Sensitivity): 2 ng/L (ref <18)

## 2021-05-11 LAB — D-DIMER, QUANTITATIVE: D-Dimer, Quant: <0.27 ug/mL-FEU (ref 0.00–0.50)

## 2021-05-11 NOTE — ED Triage Notes (Signed)
Patient to ER via POV with complaints of left sided rib-back pain that radiates into left side of chest, associated with shortness of breath and worse with inspiration. Reports pain started when sitting in church and without injury. Denies recent illness. Recently travelled to [place].  Hx of left sided back pain- was seen at chiropractor and told he had a "rib out" but states this feels different.

## 2021-05-11 NOTE — ED Provider Notes (Signed)
[facility] Emergency Department Provider Note  ____________________________________________  Time seen: Approximately 4:36 PM  I have reviewed the triage vital signs and the nursing notes.   HISTORY  Chief Complaint Chest Pain    HPI Carlos Jackson is a 25 y.o. male with a past history of Crohn's colitis not currently on any medications who comes ED complaining of left-sided chest pain which is sharp, intermittent, worse with breathing, been present for several days.  Today it started again at about 10:30 AM while at rest and has been continuous since then, waxing and waning associated with shortness of breath, nonradiating.  Denies recent falls or trauma.  No significant travel hospitalization or surgery.  No history of DVT or PE.  Pain is currently 4/10 in intensity.  Past Medical History:  Diagnosis Date   Anal fissure 2014   Colitis    COVID-19 02/15/2021   Crohn's disease ([place])    Ileitis    Mouth ulcers    Perianal fistula    Perirectal abscess      There are no problems to display for this patient.    Past Surgical History:  Procedure Laterality Date   COLONOSCOPY     NO PAST SURGERIES       Prior to Admission medications   Medication Sig Start Date End Date Taking? Authorizing Provider  ciprofloxacin (CIPRO) 500 MG tablet Take 1 tablet (500 mg total) by mouth 2 (two) times daily. Patient not taking: Reported on 02/24/2021 02/19/21   Jerene Bears, MD  hydrocortisone (ANUSOL-HC) 2.5 % rectal cream Place 1 application rectally 2 (two) times daily. Patient taking differently: Place 1 application rectally as needed. 01/05/20   Levin Erp, PA  metroNIDAZOLE (FLAGYL) 250 MG tablet Take 1 tablet (250 mg total) by mouth 3 (three) times daily. Patient not taking: Reported on 02/24/2021 02/19/21   Jerene Bears, MD  PREBIOTIC PRODUCT PO Take 2 Scoops by mouth 2 (two) times daily.    [provider]  Probiotic Product  (PROBIOTIC-10 PO) Take 2 Scoops by mouth 2 (two) times daily.    [provider]     Allergies Patient has no known allergies.   Family History  Problem Relation Age of Onset   Depression Mother    Anxiety disorder Mother    OCD Mother    Breast cancer Maternal Grandmother    Hypertension Maternal Grandfather    Heart block Maternal Grandfather    Crohn's disease Cousin    Colon cancer Neg Hx    Esophageal cancer Neg Hx    Stomach cancer Neg Hx     Social History Social History   Tobacco Use   Smoking status: Never   Smokeless tobacco: Never  Vaping Use   Vaping Use: Never used  Substance Use Topics   Alcohol use: Not Currently   Drug use: Never    Review of Systems  Constitutional:   No fever or chills.  ENT:   No sore throat. No rhinorrhea. Cardiovascular: Positive chest pain as above without syncope. Respiratory:   Positive shortness of breath without cough. Gastrointestinal:   Negative for abdominal pain, vomiting and diarrhea.  Musculoskeletal:   Negative for focal pain or swelling All other systems reviewed and are negative except as documented above in ROS and HPI.  ____________________________________________   PHYSICAL EXAM:  VITAL SIGNS: ED Triage Vitals [05/11/21 1455]  Enc Vitals Group     BP 127/88     Pulse Rate (!) 54  Resp 18     Temp 98.6 F (37 C)     Temp Source Oral     SpO2 100 %     Weight 140 lb (63.5 kg)     Height 5' 11"  (1.803 m)     Head Circumference      Peak Flow      Pain Score 7     Pain Loc      Pain Edu?      Excl. in [place]?     Vital signs reviewed, nursing assessments reviewed.   Constitutional:   Alert and oriented. Non-toxic appearance. Eyes:   Conjunctivae are normal. EOMI. PERRL. ENT      Head:   Normocephalic and atraumatic.      Nose:   Wearing a mask.      Mouth/Throat:   Wearing a mask.      Neck:   No meningismus. Full ROM. Hematological/Lymphatic/Immunilogical:   No cervical  lymphadenopathy. Cardiovascular:   RRR. Symmetric bilateral radial and DP pulses.  No murmurs. Cap refill less than 2 seconds. Respiratory:   Normal respiratory effort without tachypnea/retractions. Breath sounds are clear and equal bilaterally. No wheezes/rales/rhonchi. Gastrointestinal:   Soft and nontender. Non distended. There is no CVA tenderness.  No rebound, rigidity, or guarding. Genitourinary:   deferred Musculoskeletal:   Normal range of motion in all extremities. No joint effusions.  No lower extremity tenderness.  No edema. Neurologic:   Normal speech and language.  Motor grossly intact. No acute focal neurologic deficits are appreciated.    ____________________________________________    LABS (pertinent positives/negatives) (all labs ordered are listed, but only abnormal results are displayed) Labs Reviewed  BASIC METABOLIC PANEL  CBC  D-DIMER, QUANTITATIVE  TROPONIN I (HIGH SENSITIVITY)  TROPONIN I (HIGH SENSITIVITY)   ____________________________________________   EKG  Interpreted by me Normal sinus rhythm rate of 62, normal axis and intervals.  Normal QRS ST segments and T waves.  No evidence of right heart strain.  ____________________________________________    RADIOLOGY  DG Chest 2 View  Result Date: 05/11/2021 CLINICAL DATA:  CP EXAM: CHEST - 2 VIEW COMPARISON:  None. FINDINGS: The cardiomediastinal silhouette is normal in contour. No pleural effusion. No pneumothorax. No acute pleuroparenchymal abnormality. Visualized abdomen is unremarkable. No acute osseous abnormality noted. IMPRESSION: No acute cardiopulmonary abnormality. Electronically Signed   By: Valentino Saxon M.D.   On: 05/11/2021 15:26    ____________________________________________   PROCEDURES Procedures  ____________________________________________  DIFFERENTIAL DIAGNOSIS   Musculoskeletal chest wall pain, pneumothorax, pneumonia, pulmonary embolism, GERD  CLINICAL IMPRESSION  / ASSESSMENT AND PLAN / ED COURSE  Medications ordered in the ED: Medications - No data to display  Pertinent labs & imaging results that were available during my care of the patient were reviewed by me and considered in my medical decision making (see chart for details).  Carlos Jackson was evaluated in Emergency Department on 05/11/2021 for the symptoms described in the history of present illness. He was evaluated in the context of the global COVID-19 pandemic, which necessitated consideration that the patient might be at risk for infection with the SARS-CoV-2 virus that causes COVID-19. Institutional protocols and algorithms that pertain to the evaluation of patients at risk for COVID-19 are in a state of rapid change based on information released by regulatory bodies including the CDC and federal and state organizations. These policies and algorithms were followed during the patient's care in the ED.   Patient presents with atypical chest pain.  Nontoxic  and very well and comfortable appearing.  Low risk for PE.  Will further rule stratify with D-dimer.  EKG and troponin and other labs are normal, doubt ischemia.  Chest x-ray viewed and interpreted by me and appears normal.  Doubt ACS, dissection, carditis, pericardial effusion.  If D-dimer is below cutoff, I think patient is stable for discharge with trial of heat therapy and NSAIDs.  Offered pain medication on initial evaluation, patient declines for now.  Clinical Course as of 05/11/21 1739  Sun May 11, 2021  1737 D-dimer and repeat troponin are both normal.  Vitals remain normal.  Stable for discharge, trial of NSAIDs and heat therapy. [PS]    Clinical Course User Index [PS] Carrie Mew, MD     ____________________________________________   FINAL CLINICAL IMPRESSION(S) / ED DIAGNOSES    Final diagnoses:  Nonspecific chest pain     ED Discharge Orders     None       Portions of this note were generated with dragon  dictation software. Dictation errors may occur despite best attempts at proofreading.    Carrie Mew, MD 05/11/21 1739

## 2021-05-11 NOTE — Discharge Instructions (Addendum)
Your examination today, including EKG, chest x-ray, and labs are all normal.  We are not able to identify a specific cause of your pain but your work-up today is reassuring.  Try taking ibuprofen or Aleve for the next few days and use warm compresses or a heating pad on the area to see if this relieves the symptoms.

## 2021-06-05 ENCOUNTER — Encounter: Payer: Self-pay | Admitting: Internal Medicine

## 2021-06-05 ENCOUNTER — Other Ambulatory Visit (INDEPENDENT_AMBULATORY_CARE_PROVIDER_SITE_OTHER): Payer: 59

## 2021-06-05 ENCOUNTER — Ambulatory Visit (INDEPENDENT_AMBULATORY_CARE_PROVIDER_SITE_OTHER): Payer: 59 | Admitting: Internal Medicine

## 2021-06-05 VITALS — BP 108/60 | HR 82 | Ht 71.0 in | Wt 142.2 lb

## 2021-06-05 DIAGNOSIS — K50819 Crohn's disease of both small and large intestine with unspecified complications: Secondary | ICD-10-CM | POA: Diagnosis not present

## 2021-06-05 DIAGNOSIS — K50813 Crohn's disease of both small and large intestine with fistula: Secondary | ICD-10-CM | POA: Diagnosis not present

## 2021-06-05 DIAGNOSIS — K50113 Crohn's disease of large intestine with fistula: Secondary | ICD-10-CM | POA: Diagnosis not present

## 2021-06-05 LAB — HIGH SENSITIVITY CRP: CRP, High Sensitivity: 0.79 mg/L (ref 0.000–5.000)

## 2021-06-05 LAB — C-REACTIVE PROTEIN: CRP: <1 mg/dL (ref 0.5–20.0)

## 2021-06-05 LAB — COMPREHENSIVE METABOLIC PANEL
ALT: 22 U/L (ref 0–53)
AST: 22 U/L (ref 0–37)
Albumin: 4.7 g/dL (ref 3.5–5.2)
Alkaline Phosphatase: 78 U/L (ref 39–117)
BUN: 19 mg/dL (ref 6–23)
CO2: 30 mEq/L (ref 19–32)
Calcium: 9.3 mg/dL (ref 8.4–10.5)
Chloride: 103 mEq/L (ref 96–112)
Creatinine, Ser: 1.09 mg/dL (ref 0.40–1.50)
GFR: 94.67 mL/min (ref >60.00)
Glucose, Bld: 79 mg/dL (ref 70–99)
Potassium: 4 mEq/L (ref 3.5–5.1)
Sodium: 141 mEq/L (ref 135–145)
Total Bilirubin: 0.5 mg/dL (ref 0.2–1.2)
Total Protein: 7.9 g/dL (ref 6.0–8.3)

## 2021-06-05 LAB — IBC + FERRITIN
Ferritin: 46.7 ng/mL (ref 22.0–322.0)
Iron: 88 ug/dL (ref 42–165)
Saturation Ratios: 25.1 % (ref 20.0–50.0)
TIBC: 350 ug/dL (ref 250.0–450.0)
Transferrin: 250 mg/dL (ref 212.0–360.0)

## 2021-06-05 LAB — CBC WITH DIFFERENTIAL/PLATELET
Basophils Absolute: 0 10*3/uL (ref 0.0–0.1)
Basophils Relative: 0.8 % (ref 0.0–3.0)
Eosinophils Absolute: 0.3 10*3/uL (ref 0.0–0.7)
Eosinophils Relative: 6.3 % — ABNORMAL HIGH (ref 0.0–5.0)
HCT: 44.1 % (ref 39.0–52.0)
Hemoglobin: 15.2 g/dL (ref 13.0–17.0)
Lymphocytes Relative: 24.1 % (ref 12.0–46.0)
Lymphs Abs: 1.2 10*3/uL (ref 0.7–4.0)
MCHC: 34.5 g/dL (ref 30.0–36.0)
MCV: 86.2 fl (ref 78.0–100.0)
Monocytes Absolute: 0.4 10*3/uL (ref 0.1–1.0)
Monocytes Relative: 7.4 % (ref 3.0–12.0)
Neutro Abs: 3.2 10*3/uL (ref 1.4–7.7)
Neutrophils Relative %: 61.4 % (ref 43.0–77.0)
Platelets: 158 10*3/uL (ref 150.0–400.0)
RBC: 5.11 Mil/uL (ref 4.22–5.81)
RDW: 12.9 % (ref 11.5–15.5)
WBC: 5.2 10*3/uL (ref 4.0–10.5)

## 2021-06-05 LAB — SEDIMENTATION RATE: Sed Rate: 7 mm/hr (ref 0–15)

## 2021-06-05 MED ORDER — SUTAB 1479-225-188 MG PO TABS: 1.0000 | ORAL_TABLET | ORAL | 0 refills | Status: DC

## 2021-06-05 NOTE — Patient Instructions (Signed)
If you are age 25 or younger, your body mass index should be between 19-25. Your Body mass index is 19.84 kg/m. If this is out of the aformentioned range listed, please consider follow up with your Primary Care Provider.  ________________________________________________________  The [place] providers would like to encourage you to use [facility] to communicate with providers for non-urgent requests or questions.  Due to long hold times on the telephone, sending your provider a message by [facility] may be a faster and more efficient way to get a response.  Please allow 48 business hours for a response.  Please remember that this is for non-urgent requests.   You have been scheduled for an endoscopy and colonoscopy. Please follow the written instructions given to you at your visit today. Please pick up your prep supplies at the pharmacy within the next 1-3 days. If you use inhalers (even only as needed), please bring them with you on the day of your procedure.  Your provider has requested that you go to the basement level for lab work before leaving today. Press "B" on the elevator. The lab is located at the first door on the left as you exit the elevator. *Please be sure to have your labs done today.  Follow up pending the results of your Colonoscopy.

## 2021-06-06 ENCOUNTER — Encounter: Payer: Self-pay | Admitting: Internal Medicine

## 2021-06-06 NOTE — Progress Notes (Signed)
   Subjective:    Patient ID: Carlos Jackson, male    DOB: Aug 13, 1996, 25 y.o.   MRN: [id number]  HPI [name] is a 25 year old male with a history of ileocolonic Crohn's disease with perianal involvement complicated by fistula (diagnosis June 2021) previously treated with Humira stopped after antibodies developed who is here for follow-up.  He is here today alone.  He was last seen on 02/24/2021.  He reports he forgot to have lab work done after his last office visit.  He was seen in the emergency department last month for left-sided chest pain which was pleuritic in nature.  He feels that this was likely secondary to rib type pain and improved after he saw a chiropractor.  He reports that overall his been feeling well.  He is not having abdominal pain.  His bowel movements have been regular without diarrhea or blood.  He has not had any further oral ulcerations.  He will have occasional sharp rectal pain which feels like an internal rectal type pain usually short-lived lasting 5 minutes and usually only once per day.  Not every day.  It is irregular in occurrence.  It is not nocturnal.  Most likely to happen in the morning before bowel movement.  No perianal drainage or pain recently.  He has read more about Biologics and reports that he is inclined to resume and try another biologic but his wife is concerned about malignancy risk associated with some of these medications.  Review of Systems As per HPI, otherwise negative  Current Medications, Allergies, Past Medical History, Past Surgical History, Family History and Social History were reviewed in [employer] record.    Objective:   Physical Exam BP 108/60   Pulse 82   Ht 5' 11"  (1.803 m)   Wt 142 lb 4 oz (64.5 kg)   BMI 19.84 kg/m  Gen: awake, alert, NAD HEENT: anicteric Neuro: nonfocal      Assessment & Plan:  25 year old male with a history of ileocolonic Crohn's disease with perianal involvement  complicated by fistula (diagnosis June 2021) previously treated with Humira stopped after antibodies developed who is here for follow-up.   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  It is possible that this is due to active Crohn's disease in the rectum.  He has read more about Biologics and we discussed multiple Biologics today including but not limited to infliximab, Entyvio, Stelara.  We also discussed azathioprine.  He does have a history of developing antibodies to Humira rather quickly and so we should consider low-dose immunomodulator therapy to augment antibody response to future therapy should this be what we pursue.  We discussed objectively determining active disease by fecal calprotectin and colonoscopy. --Fecal calprotectin, CBC, CRP, ESR -- Colonoscopy --He will continue to research Biologics and we discussed that Weyman Rodney may be a very good option for him.  This is got selective and not associated with malignancy.  We did discuss PML but this has not been described with Entyvio as with Tysabri.  Entyvio does not cross the blood-brain barrier.  40 minutes total spent today including patient facing time, coordination of care, reviewing medical history/procedures/pertinent radiology studies, and documentation of the encounter.

## 2021-06-09 LAB — HEPATITIS B SURFACE ANTIBODY,QUALITATIVE: Hep B S Ab: REACTIVE — AB

## 2021-06-09 LAB — QUANTIFERON-TB GOLD PLUS
Mitogen-NIL: >10 IU/mL
NIL: 0.02 IU/mL
QuantiFERON-TB Gold Plus: NEGATIVE
TB1-NIL: 0.02 IU/mL
TB2-NIL: 0 IU/mL

## 2021-06-09 LAB — HEPATITIS B SURFACE ANTIGEN: Hepatitis B Surface Ag: NONREACTIVE

## 2021-06-09 LAB — HEPATITIS B CORE ANTIBODY, TOTAL: Hep B Core Total Ab: NONREACTIVE

## 2021-06-13 ENCOUNTER — Other Ambulatory Visit: Payer: 59

## 2021-06-13 DIAGNOSIS — K50813 Crohn's disease of both small and large intestine with fistula: Secondary | ICD-10-CM

## 2021-06-21 LAB — CALPROTECTIN: Calprotectin: 90 mcg/g

## 2021-07-18 ENCOUNTER — Encounter: Payer: Self-pay | Admitting: Internal Medicine

## 2021-07-21 ENCOUNTER — Encounter: Payer: Self-pay | Admitting: Internal Medicine

## 2021-07-24 ENCOUNTER — Encounter: Payer: Self-pay | Admitting: Certified Registered Nurse Anesthetist

## 2021-07-25 ENCOUNTER — Other Ambulatory Visit: Payer: Self-pay

## 2021-07-25 ENCOUNTER — Ambulatory Visit (AMBULATORY_SURGERY_CENTER): Payer: 59 | Admitting: Internal Medicine

## 2021-07-25 ENCOUNTER — Encounter: Payer: Self-pay | Admitting: Internal Medicine

## 2021-07-25 VITALS — BP 109/59 | HR 61 | Temp 98.6°F | Resp 12 | Ht 71.0 in | Wt 142.0 lb

## 2021-07-25 DIAGNOSIS — D124 Benign neoplasm of descending colon: Secondary | ICD-10-CM

## 2021-07-25 DIAGNOSIS — K5140 Inflammatory polyps of colon without complications: Secondary | ICD-10-CM

## 2021-07-25 DIAGNOSIS — K50819 Crohn's disease of both small and large intestine with unspecified complications: Secondary | ICD-10-CM

## 2021-07-25 DIAGNOSIS — K529 Noninfective gastroenteritis and colitis, unspecified: Secondary | ICD-10-CM | POA: Diagnosis not present

## 2021-07-25 MED ORDER — SODIUM CHLORIDE 0.9 % IV SOLN: 500.0000 mL | Freq: Once | INTRAVENOUS | Status: DC

## 2021-07-25 NOTE — Op Note (Signed)
[place] Patient Name: Carlos Jackson Procedure Date: 07/25/2021 9:42 AM MRN: [id number] Endoscopist: Jerene Bears , MD Age: 25 Referring MD:  Date of Birth: 1995/09/19 Gender: Male Account #: [id number] Procedure:                Colonoscopy Indications:              Disease activity assessment of Crohn's disease of                            the small bowel and colon (index colonoscopy with                            Crohn's diagnosis June 2021), initial treatment                            with Humira stopped due to antibody formation,                            currently not on IBD therapy, recent fecal                            calprotectin 90 (borderline) Medicines:                Monitored Anesthesia Care Procedure:                Pre-Anesthesia Assessment:                           - Prior to the procedure, a History and Physical                            was performed, and patient medications and                            allergies were reviewed. The patient's tolerance of                            previous anesthesia was also reviewed. The risks                            and benefits of the procedure and the sedation                            options and risks were discussed with the patient.                            All questions were answered, and informed consent                            was obtained. Prior Anticoagulants: The patient has                            taken no previous anticoagulant or antiplatelet  agents. ASA Grade Assessment: II - A patient with                            mild systemic disease. After reviewing the risks                            and benefits, the patient was deemed in                            satisfactory condition to undergo the procedure.                           After obtaining informed consent, the colonoscope                            was passed under direct vision. Throughout the                             procedure, the patient's blood pressure, pulse, and                            oxygen saturations were monitored continuously. The                            Olympus PCF-H190DL ([id number]) Colonoscope was                            introduced through the anus and advanced to the                            terminal ileum. The colonoscopy was performed                            without difficulty. The patient tolerated the                            procedure well. The quality of the bowel                            preparation was good. The terminal ileum, ileocecal                            valve, appendiceal orifice, and rectum were                            photographed. Scope In: 9:51:13 AM Scope Out: 10:10:53 AM Scope Withdrawal Time: 0 hours 14 minutes 45 seconds  Total Procedure Duration: 0 hours 19 minutes 40 seconds  Findings:                 The digital rectal exam findings include scarring                            with perianal skin tags. No fistulae seen.  Inflammation, moderate in severity and                            characterized by erosions, erythema and deep                            ulcerations was found in the terminal ileum and in                            the ileocecal valve. Biopsies were taken with a                            cold forceps for histology.                           A 4 mm polyp was found in the descending colon. The                            polyp was sessile. The polyp was removed with a                            cold snare. Resection and retrieval were complete.                           The proximal rectum, mid rectum, sigmoid colon,                            descending colon, transverse colon, ascending colon                            and cecum appeared normal.                           An area of mildly erythematous mucosa was found at                            the anus and in the  distal rectum. There is                            scarring from previously active Crohn's. This was                            biopsied with a cold forceps for histology. Complications:            No immediate complications. Estimated Blood Loss:     Estimated blood loss was minimal. Impression:               - Scarring in the anal canal palpable on digital                            rectal exam.                           - Crohn's disease with ileitis. Biopsied.                           -  One 4 mm polyp in the descending colon, removed                            with a cold snare. Resected and retrieved.                           - The proximal rectum, mid rectum, sigmoid colon,                            descending colon, transverse colon, ascending colon                            and cecum are normal. Much improved compared to                            last colonoscopy.                           - Erythematous mucosa at the anus and in the distal                            rectum. Biopsied to assess for Crohn's activity in                            the distal rectum/anal canal.                           - No fistulae seen today. Recommendation:           - Patient has a contact number available for                            emergencies. The signs and symptoms of potential                            delayed complications were discussed with the                            patient. Return to normal activities tomorrow.                            Written discharge instructions were provided to the                            patient.                           - Resume previous diet.                           - Continue present medications.                           - Await pathology results.                           -  Consideration of new biologic therapy once                            pathology results reviewed. Jerene Bears, MD 07/25/2021 10:22:09 AM This report has been signed  electronically.

## 2021-07-25 NOTE — Progress Notes (Signed)
VS-CW

## 2021-07-25 NOTE — Patient Instructions (Signed)
Await pathology results. Resume previous diet and continue present medications. Consideration of new biologic therapy once pathology results reviewed. Repeat colonoscopy for screening purposes will be based off of pathology results.   YOU HAD AN ENDOSCOPIC PROCEDURE TODAY AT [facility]:   Refer to the procedure report that was given to you for any specific questions about what was found during the examination.  If the procedure report does not answer your questions, please call your gastroenterologist to clarify.  If you requested that your care partner not be given the details of your procedure findings, then the procedure report has been included in a sealed envelope for you to review at your convenience later.  YOU SHOULD EXPECT: Some feelings of bloating in the abdomen. Passage of more gas than usual.  Walking can help get rid of the air that was put into your GI tract during the procedure and reduce the bloating. If you had a lower endoscopy (such as a colonoscopy or flexible sigmoidoscopy) you may notice spotting of blood in your stool or on the toilet paper. If you underwent a bowel prep for your procedure, you may not have a normal bowel movement for a few days.  Please Note:  You might notice some irritation and congestion in your nose or some drainage.  This is from the oxygen used during your procedure.  There is no need for concern and it should clear up in a day or so.  SYMPTOMS TO REPORT IMMEDIATELY:  Following lower endoscopy (colonoscopy or flexible sigmoidoscopy):  Excessive amounts of blood in the stool  Significant tenderness or worsening of abdominal pains  Swelling of the abdomen that is new, acute  Fever of 100F or higher  For urgent or emergent issues, a gastroenterologist can be reached at any hour by calling [phone]. Do not use MyChart messaging for urgent concerns.    DIET:  We do recommend a small meal at first, but then you may proceed to  your regular diet.  Drink plenty of fluids but you should avoid alcoholic beverages for 24 hours.  ACTIVITY:  You should plan to take it easy for the rest of today and you should NOT DRIVE or use heavy machinery until tomorrow (because of the sedation medicines used during the test).    FOLLOW UP: Our staff will call the number listed on your records 48-72 hours following your procedure to check on you and address any questions or concerns that you may have regarding the information given to you following your procedure. If we do not reach you, we will leave a message.  We will attempt to reach you two times.  During this call, we will ask if you have developed any symptoms of COVID 19. If you develop any symptoms (ie: fever, flu-like symptoms, shortness of breath, cough etc.) before then, please call [phone].  If you test positive for Covid 19 in the 2 weeks post procedure, please call and report this information to [place].    If any biopsies were taken you will be contacted by phone or by letter within the next 1-3 weeks.  Please call us at [phone] if you have not heard about the biopsies in 3 weeks.    SIGNATURES/CONFIDENTIALITY: You and/or your care partner have signed paperwork which will be entered into your electronic medical record.  These signatures attest to the fact that that the information above on your After Visit Summary has been reviewed and is understood.  Full responsibility  of the confidentiality of this discharge information lies with you and/or your care-partner.

## 2021-07-25 NOTE — Progress Notes (Signed)
Report given to PACU, vss 

## 2021-07-25 NOTE — Progress Notes (Signed)
GASTROENTEROLOGY PROCEDURE H&P NOTE   Primary Care Physician: Derinda Late, MD    Reason for Procedure:  Crohn's disease  Plan:    Colonoscopy  Patient is appropriate for endoscopic procedure(s) in the ambulatory ([place]) setting.  The nature of the procedure, as well as the risks, benefits, and alternatives were carefully and thoroughly reviewed with the patient. Ample time for discussion and questions allowed. The patient understood, was satisfied, and agreed to proceed.     HPI: Carlos Jackson is a 25 y.o. male who presents for colonoscopy to evaluate disease activity in the setting of Crohn's disease (ileocolonic with perianal involvement).  Tolerated the prep.  No complaints today without recent chest pain or shortness of breath.  Past Medical History:  Diagnosis Date   Allergy    Anal fissure 2014   Colitis    COVID-19 02/15/2021   Crohn's disease ([place])    GERD (gastroesophageal reflux disease)    Ileitis    Mouth ulcers    Perianal fistula    Perirectal abscess     Past Surgical History:  Procedure Laterality Date   COLONOSCOPY     NO PAST SURGERIES     UPPER GASTROINTESTINAL ENDOSCOPY     WISDOM TOOTH EXTRACTION      Prior to Admission medications   Medication Sig Start Date End Date Taking? Authorizing Provider  PREBIOTIC PRODUCT PO Take 2 Scoops by mouth 2 (two) times daily.   Yes [provider]  Probiotic Product (PROBIOTIC-10 PO) Take 2 Scoops by mouth 2 (two) times daily.   Yes [provider]    Current Outpatient Medications  Medication Sig Dispense Refill   PREBIOTIC PRODUCT PO Take 2 Scoops by mouth 2 (two) times daily.     Probiotic Product (PROBIOTIC-10 PO) Take 2 Scoops by mouth 2 (two) times daily.     Current Facility-Administered Medications  Medication Dose Route Frequency Provider Last Rate Last Admin   0.9 %  sodium chloride infusion  500 mL Intravenous Once Tamia Dial, Lajuan Lines, MD        Allergies as of 07/25/2021    (No Known Allergies)    Family History  Problem Relation Age of Onset   Depression Mother    Anxiety disorder Mother    OCD Mother    Breast cancer Maternal Grandmother    Hypertension Maternal Grandfather    Heart block Maternal Grandfather    Crohn's disease Cousin    Colon cancer Neg Hx    Esophageal cancer Neg Hx    Stomach cancer Neg Hx    Rectal cancer Neg Hx     Social History   Socioeconomic History   Marital status: Married    Spouse name: Not on file   Number of children: 0   Years of education: Not on file   Highest education level: Not on file  Occupational History   Occupation: [employer]  Tobacco Use   Smoking status: Never   Smokeless tobacco: Never  Vaping Use   Vaping Use: Never used  Substance and Sexual Activity   Alcohol use: Not Currently   Drug use: Never   Sexual activity: Not on file  Other Topics Concern   Not on file  Social History Narrative   Not on file   Social Determinants of Health   Financial Resource Strain: Not on file  Food Insecurity: Not on file  Transportation Needs: Not on file  Physical Activity: Not on file  Stress: Not on file  Social Connections: Not on file  Intimate Partner Violence: Not on file    Physical Exam: Vital signs in last 24 hours: @BP  136/74   Pulse (!) 52   Temp 98.6 F (37 C) (Temporal)   Ht 5' 11"  (1.803 m)   Wt 142 lb (64.4 kg)   SpO2 100%   BMI 19.80 kg/m  GEN: NAD EYE: Sclerae anicteric ENT: MMM CV: Non-tachycardic Pulm: CTA b/l GI: Soft, NT/ND NEURO:  Alert & Oriented x 3   Zenovia Jarred, MD [facility]  07/25/2021 9:38 AM

## 2021-07-25 NOTE — Progress Notes (Signed)
Called to room to assist during endoscopic procedure.  Patient ID and intended procedure confirmed with present staff. Received instructions for my participation in the procedure from the performing physician.  

## 2021-07-29 ENCOUNTER — Telehealth: Payer: Self-pay | Admitting: *Deleted

## 2021-07-29 NOTE — Telephone Encounter (Signed)
Attempted to call patient for their post-procedure follow-up call. No answer. Unable to leave voicemail due to voicemail being full. Will try to call again around lunch time.

## 2021-07-29 NOTE — Telephone Encounter (Signed)
Attempted 2nd f/u phone call. No answer. Mailbox is full, unable to leave message.

## 2021-07-30 ENCOUNTER — Encounter: Payer: Self-pay | Admitting: Internal Medicine

## 2021-08-04 ENCOUNTER — Encounter: Payer: Self-pay | Admitting: *Deleted

## 2021-08-05 ENCOUNTER — Encounter: Payer: Self-pay | Admitting: Internal Medicine

## 2021-08-05 ENCOUNTER — Ambulatory Visit (INDEPENDENT_AMBULATORY_CARE_PROVIDER_SITE_OTHER): Payer: 59 | Admitting: Internal Medicine

## 2021-08-05 VITALS — BP 118/60 | HR 54 | Ht 71.0 in | Wt 142.0 lb

## 2021-08-05 DIAGNOSIS — H5713 Ocular pain, bilateral: Secondary | ICD-10-CM | POA: Diagnosis not present

## 2021-08-05 DIAGNOSIS — K50113 Crohn's disease of large intestine with fistula: Secondary | ICD-10-CM | POA: Diagnosis not present

## 2021-08-05 DIAGNOSIS — K50813 Crohn's disease of both small and large intestine with fistula: Secondary | ICD-10-CM | POA: Diagnosis not present

## 2021-08-05 NOTE — Patient Instructions (Signed)
If you are age 25 or older, your body mass index should be between 23-30. Your Body mass index is 19.8 kg/m. If this is out of the aforementioned range listed, please consider follow up with your Primary Care Provider.  If you are age 1 or younger, your body mass index should be between 19-25. Your Body mass index is 19.8 kg/m. If this is out of the aformentioned range listed, please consider follow up with your Primary Care Provider.   ________________________________________________________  The [facility] providers would like to encourage you to use [facility] to communicate with providers for non-urgent requests or questions.  Due to long hold times on the telephone, sending your provider a message by [facility] may be a faster and more efficient way to get a response.  Please allow 48 business hours for a response.  Please remember that this is for non-urgent requests.  _______________________________________________________   It was a pleasure to see you today!  Thank you for trusting me with your gastrointestinal care!    Zenovia Jarred, MD

## 2021-08-05 NOTE — Progress Notes (Signed)
Subjective:    Patient ID: Carlos Jackson, male    DOB: June 19, 1996, 25 y.o.   MRN: [id number]  HPI Carlos Jackson is a 25 year old male with a history of ileocolonic Crohn's disease with perianal involvement complicated by perianal fistula (diagnosis June 2021), previously treated with Humira stopped after antibodies developed who is here for follow-up.  He had a recent colonoscopy.  He is here alone today.  I performed a colonoscopy for him on 07/25/2021.  This revealed moderate inflammation in the terminal ileum at the ileocecal valve.  There was a 4 mm inflammatory polyp removed from the descending colon.  There was erythematous and granular mucosa with scarring in the distal rectum and anal rectal canal.  The proximal rectum to cecum appeared normal which was improved compared to index colonoscopy in June 2021.  Pathology results in the ileum showed chronic active inflammation consistent with Crohn's.  Again the polyp was inflammatory pseudopolyp without adenomatous change.  Anal rectal biopsies showed mild chronic colitis and pseudopolyp.  No dysplasia.  He reports on the whole he is certainly feeling better.  He still has intermittent distal rectal pain which can be "jabbing" in nature.  No pain with passing stool.  No bleeding.  No abdominal pain.  He does feel at times it is harder to initiate bowel movement.  No upper GI or hepatobiliary complaint.  He has noticed increased trouble with his eyes.  He feels like there is a film or grain over his eyes as well as some blurry vision and burning.  This seems to be worse in both eyes.   Review of Systems As per HPI, otherwise negative  Current Medications, Allergies, Past Medical History, Past Surgical History, Family History and Social History were reviewed in [employer] record.    Objective:   Physical Exam BP 118/60    Pulse (!) 54    Ht 5\' 11"  (1.803 m)    Wt 142 lb (64.4 kg)    BMI 19.80 kg/m  Gen: awake, alert,  NAD HEENT: anicteric Abd: soft, NT/ND, +BS throughout Ext: no c/c/e Neuro: nonfocal     Assessment & Plan:  25 year old male with a history of ileocolonic Crohn's disease with perianal involvement complicated by perianal fistula (diagnosis June 2021), previously treated with Humira stopped after antibodies developed who is here for follow-up.   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  He did take Humira for some time but developed antibodies quickly and this was discontinued.  He still has active ileitis as well as perianal disease.  We spent considerable time today discussing his current disease activity and future treatment.  He is open and understands the need for additional therapy to prevent complications and help promote healing.  He is most open to [facility] after discussing additional anti-TNF therapy and other options.  He does have concern about malignancy associated with anti-TNF therapy as well as immunomodulator therapy.  He plans to discuss this further with his wife and likely we will begin Entyvio shortly. --I will wait to hear back from him after he discusses this further with his wife and anticipate we will start Entyvio at the [facility] --I would like to see him about 8 to 12 weeks after he starts new biologic therapy and at some point we will consider disease activity assessment colonoscopy  2.  Burning eye pain --I feel it very  important to exclude inflammatory eye disease such as uveitis.  We discussed the association with IBD. --Ophthalmology consultation which she will seek likely in [place]  45 minutes total spent today including patient facing time, coordination of care, reviewing medical history/procedures/pertinent radiology studies, and documentation of the encounter.

## 2021-09-18 ENCOUNTER — Encounter: Payer: Self-pay | Admitting: Internal Medicine

## 2021-11-11 ENCOUNTER — Encounter: Payer: Self-pay | Admitting: Internal Medicine

## 2021-11-11 ENCOUNTER — Ambulatory Visit (INDEPENDENT_AMBULATORY_CARE_PROVIDER_SITE_OTHER): Payer: 59 | Admitting: Internal Medicine

## 2021-11-11 ENCOUNTER — Other Ambulatory Visit: Payer: Self-pay | Admitting: Internal Medicine

## 2021-11-11 VITALS — BP 100/60 | HR 54 | Ht 71.0 in | Wt 145.0 lb

## 2021-11-11 DIAGNOSIS — K50819 Crohn's disease of both small and large intestine with unspecified complications: Secondary | ICD-10-CM

## 2021-11-11 DIAGNOSIS — K5000 Crohn's disease of small intestine without complications: Secondary | ICD-10-CM | POA: Insufficient documentation

## 2021-11-11 DIAGNOSIS — K5010 Crohn's disease of large intestine without complications: Secondary | ICD-10-CM

## 2021-11-11 NOTE — Patient Instructions (Addendum)
You have been scheduled for a follow up with Dr Hilarie Fredrickson on 02/11/22 at 10:10 am. ? ?If you are age 26 or older, your body mass index should be between 23-30. Your Body mass index is 20.22 kg/m?Marland Kitchen If this is out of the aforementioned range listed, please consider follow up with your Primary Care Provider. ? ?If you are age 21 or younger, your body mass index should be between 19-25. Your Body mass index is 20.22 kg/m?Marland Kitchen If this is out of the aformentioned range listed, please consider follow up with your Primary Care Provider.  ? ?________________________________________________________ ? ?The [place] [facility] providers would like to encourage you to use [facility] to communicate with providers for non-urgent requests or questions.  Due to long hold times on the telephone, sending your provider a message by [facility] may be a faster and more efficient way to get a response.  Please allow 48 business hours for a response.  Please remember that this is for non-urgent requests.  ?_______________________________________________________ ?Due to recent changes in healthcare laws, you may see the results of your imaging and laboratory studies on MyChart before your provider has had a chance to review them.  We understand that in some cases there may be results that are confusing or concerning to you. Not all laboratory results come back in the same time frame and the provider may be waiting for multiple results in order to interpret others.  Please give [place] 48 hours in order for your provider to thoroughly review all the results before contacting the office for clarification of your results.  ? ?

## 2021-11-12 ENCOUNTER — Encounter: Payer: Self-pay | Admitting: Internal Medicine

## 2021-11-12 ENCOUNTER — Telehealth: Payer: Self-pay | Admitting: Pharmacy Technician

## 2021-11-12 NOTE — Progress Notes (Signed)
? ?  Subjective:  ? ? Patient ID: Carlos Jackson, male    DOB: [birth year], 26 y.o.   MRN: [id number] ? 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  He is here alone today and I last saw him on 08/05/2021. ? ?See previous note and documentation regarding colonoscopy performed on 08/04/2021. ? ?He reports that he is feeling well.  The only symptom that he really has not been able to have improved is his fatigue and overall stamina/energy levels.  He reports normal bowel movement.  No rectal bleeding.  No abdominal or perianal pain or drainage.  No upper GI or hepatobiliary complaint. ? ?He did want to update me that he has learned the Crohn's runs in his mother's family.  His maternal cousin as well as several of his maternal grandmothers cousins have had Crohn's disease. ? ?He has had some minor nosebleeds.  Wonders whether this could be a former side effect of Humira. ? ?He reports that he and his wife have discussed Entyvio and he wishes to proceed though he had multiple questions including risk of PML. ? ? ?Review of Systems ?As per HPI, otherwise negative ? ?Current Medications, Allergies, Past Medical History, Past Surgical History, Family History and Social History were reviewed in [employer] record. ?   ?Objective:  ? Physical Exam ?BP 100/60   Pulse (!) 54   Ht 5' 11"  (1.803 m)   Wt 145 lb (65.8 kg)   BMI 20.22 kg/m?  ?Gen: awake, alert, NAD ?HEENT: anicteric ?Neuro: nonfocal ? ? ?   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  ? ?Ileocolonic Crohn's disease with perianal involvement/history of  perianal fistula (dx 2021, developed antibodies to Humira) --known disease activity based on recent colonoscopy.  He has decided to pursue Entyvio which I am in agreement with.  We did review the risks, benefits and alternatives.  We did discuss PML is "theoretically possible" but has not been described with this medication because it does not cross the blood-brain barrier.  A previous drug which does cross the blood-brain barrier, Tysabri, has been associated with PML.  I explained how these medications are different.  After reviewing the risk, benefits and alternatives he wishes to proceed and I will send this to our infusion center to start ASAP ?--Entyvio with standard induction followed by 300 mg every 8 weeks ?--Follow-up with me in June ? ?Consider repeat colonoscopy for disease activity assessment in the future ? ?30 minutes total spent today including patient facing time, coordination of care, reviewing medical history/procedures/pertinent radiology studies, and documentation of the encounter. ? ? ?

## 2021-11-12 NOTE — Telephone Encounter (Signed)
Dr. Hilarie Fredrickson, ?Fyi note: ? ?Auth Submission: APPROVED ?Payer: UHC ?Medication & CPT/J Code(s) submitted: Entyvio (Vedolizumab) [id number] ?Route of submission (phone, fax, portal): PIRTAL ?Auth type: Buy/Bill ?Units/visits requested: 300 MG ?Reference number: [id number] ?Approval from: 11/12/21 to 02/18/22  ? ?Patient will be scheduled as soon as possible. ? ? ?  ?

## 2021-11-12 NOTE — Telephone Encounter (Signed)
Entyvio co-pay card: APPROVED ? ?ID# [id number] ?BIN# [id number] ?PCN# [id number] ?GR# [id number] ?

## 2021-11-26 ENCOUNTER — Ambulatory Visit (INDEPENDENT_AMBULATORY_CARE_PROVIDER_SITE_OTHER): Payer: 59

## 2021-11-26 VITALS — BP 129/65 | HR 58 | Temp 98.3°F | Resp 16 | Ht 71.0 in | Wt 147.8 lb

## 2021-11-26 DIAGNOSIS — K5010 Crohn's disease of large intestine without complications: Secondary | ICD-10-CM

## 2021-11-26 DIAGNOSIS — K5000 Crohn's disease of small intestine without complications: Secondary | ICD-10-CM | POA: Diagnosis not present

## 2021-11-26 MED ORDER — VEDOLIZUMAB 300 MG IV SOLR
300.0000 mg | Freq: Once | INTRAVENOUS | Status: AC
  Administered 2021-11-26: 300 mg via INTRAVENOUS
  Filled 2021-11-26: qty 5

## 2021-11-26 NOTE — Progress Notes (Signed)
Diagnosis: Crohn's Disease ? ?Provider:  Marshell Garfinkel, MD ? ?Procedure: Infusion ? ?IV Type: Peripheral, IV Location: L Antecubital ? ?Entyvio (Vedolizumab), Dose: 300 mg ? ?Infusion Start Time: 0012 ? ?Infusion Stop Time: 3935 ? ?Post Infusion IV Care: Observation period completed and Peripheral IV Discontinued ? ?Discharge: Condition: Good, Destination: Home . AVS provided to patient.  ? ?Performed by:  Cleophus Molt, RN  ?  ?

## 2021-12-10 ENCOUNTER — Ambulatory Visit (INDEPENDENT_AMBULATORY_CARE_PROVIDER_SITE_OTHER): Payer: 59 | Admitting: *Deleted

## 2021-12-10 VITALS — BP 124/75 | HR 60 | Temp 98.3°F | Resp 14 | Ht 71.0 in | Wt 149.2 lb

## 2021-12-10 DIAGNOSIS — K5000 Crohn's disease of small intestine without complications: Secondary | ICD-10-CM | POA: Diagnosis not present

## 2021-12-10 DIAGNOSIS — K5010 Crohn's disease of large intestine without complications: Secondary | ICD-10-CM | POA: Diagnosis not present

## 2021-12-10 MED ORDER — VEDOLIZUMAB 300 MG IV SOLR
300.0000 mg | Freq: Once | INTRAVENOUS | Status: AC
  Administered 2021-12-10: 300 mg via INTRAVENOUS
  Filled 2021-12-10: qty 5

## 2021-12-10 NOTE — Progress Notes (Signed)
Diagnosis: Crohn's Disease ? ?Provider:  Marshell Garfinkel, MD ? ?Procedure: Infusion ? ?IV Type: Peripheral, IV Location: L Antecubital ? ?Entyvio (Vedolizumab), Dose: 300 mg ? ?Infusion Start Time: 0951  am ? ?Infusion Stop Time: 1026 AM ? ?Post Infusion IV Care: Observation period completed and Peripheral IV Discontinued ? ?Discharge: Condition: Good, Destination: Home . AVS provided to patient.  ? ?Performed by:  Oren Beckmann, RN  ?  ?

## 2021-12-30 ENCOUNTER — Telehealth: Payer: Self-pay | Admitting: Internal Medicine

## 2021-12-30 ENCOUNTER — Encounter: Payer: Self-pay | Admitting: Internal Medicine

## 2021-12-30 NOTE — Telephone Encounter (Signed)
No concerns with a slight 3-day delay for Entyvio dosing ?Thanks ?JMP ? ?

## 2021-12-30 NOTE — Telephone Encounter (Signed)
Inbound call from patients wife stating there is some concerns about moving patients appointment for infusion and is requesting a call back to discuss. Please advise.  ?

## 2021-12-30 NOTE — Telephone Encounter (Signed)
Pt due for entyvio on 5/23 but vacation plans changed a little bit. They wanted to move his tx up the the 19th but infusion center told him insurance would not cover it if given early. They can move it out and give it to him on the 26th. Pt wants to make sure this is ok since he developed antibodies to Humira. Discussed with pts wife that 3 days would probably be fine for him to schedule the entyvio. Let her know message would be sent to Dr. Hilarie Fredrickson and if he was concerned I would call them back. Please advise. ?

## 2021-12-31 NOTE — Telephone Encounter (Signed)
Spoke with pt and he is aware. ?

## 2022-01-06 ENCOUNTER — Ambulatory Visit (INDEPENDENT_AMBULATORY_CARE_PROVIDER_SITE_OTHER): Payer: 59

## 2022-01-06 VITALS — BP 88/51 | HR 61 | Temp 98.0°F | Resp 16 | Ht 71.0 in | Wt 152.0 lb

## 2022-01-06 DIAGNOSIS — K5000 Crohn's disease of small intestine without complications: Secondary | ICD-10-CM

## 2022-01-06 DIAGNOSIS — K50819 Crohn's disease of both small and large intestine with unspecified complications: Secondary | ICD-10-CM

## 2022-01-06 DIAGNOSIS — K5010 Crohn's disease of large intestine without complications: Secondary | ICD-10-CM

## 2022-01-06 MED ORDER — VEDOLIZUMAB 300 MG IV SOLR
300.0000 mg | Freq: Once | INTRAVENOUS | Status: AC
  Administered 2022-01-06: 300 mg via INTRAVENOUS
  Filled 2022-01-06: qty 5

## 2022-01-06 NOTE — Progress Notes (Signed)
Diagnosis: Crohn's Disease  Provider:  Marshell Garfinkel, MD  Procedure: Infusion  IV Type: Peripheral, IV Location: R Antecubital  Entyvio (Vedolizumab), Dose: 300 mg  Infusion Start Time: 9935  Infusion Stop Time: 7017  Post Infusion IV Care: Peripheral IV Discontinued  Discharge: Condition: Good, Destination: Home . AVS provided to patient.   Performed by:  Adelina Mings, LPN

## 2022-02-11 ENCOUNTER — Encounter: Payer: Self-pay | Admitting: Internal Medicine

## 2022-02-11 ENCOUNTER — Ambulatory Visit (INDEPENDENT_AMBULATORY_CARE_PROVIDER_SITE_OTHER): Payer: 59 | Admitting: Internal Medicine

## 2022-02-11 VITALS — BP 124/72 | HR 81 | Ht 71.0 in | Wt 154.8 lb

## 2022-02-11 DIAGNOSIS — K5000 Crohn's disease of small intestine without complications: Secondary | ICD-10-CM

## 2022-02-11 DIAGNOSIS — K5010 Crohn's disease of large intestine without complications: Secondary | ICD-10-CM

## 2022-02-11 DIAGNOSIS — K50113 Crohn's disease of large intestine with fistula: Secondary | ICD-10-CM

## 2022-02-11 NOTE — Patient Instructions (Addendum)
If you are age 26 or younger, your body mass index should be between 19-25. Your Body mass index is 21.59 kg/m. If this is out of the aformentioned range listed, please consider follow up with your Primary Care Provider.  ________________________________________________________  The [place] [facility] providers would like to encourage you to use [facility] to communicate with providers for non-urgent requests or questions.  Due to long hold times on the telephone, sending your provider a message by [facility] may be a faster and more efficient way to get a response.  Please allow 48 business hours for a response.  Please remember that this is for non-urgent requests.  _______________________________________________________  Carlos Jackson  You have been scheduled to follow up with Dr. Hilarie Fredrickson on May 13, 2022 at 9:10 am  Thank you for entrusting me with your care and choosing [facility]  Dr. Hilarie Fredrickson

## 2022-02-11 NOTE — Progress Notes (Signed)
   Subjective:    Patient ID: Carlos Jackson, male    DOB: 10/30/1995, 26 y.o.   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  He is here alone today.  I last saw him on 11/11/2021.  He has had the 3 induction Entyvio infusions without incident.  No allergic reaction.  Tolerating this well.  No sinus symptoms.  He had doses on 11/26/2021, 12/10/2021, and 01/06/2022.  He reports that on the whole he is feeling well.  He is having 1 or 2 bowel movements a day.  Bowel movements are not quite as consistent as he would hope.  He has had several episodes of crampy abdominal pain and gas.  At times this requires him to lie down.  This last happened a few weeks ago.  He had a small hemorrhoid flare but this has resolved.  He has no perianal drainage and does not feel that he has a fistula or perianal abscess at this point.  He is doing more and has been able to gain 9 pounds.  Overall he feels well but does not feel that he is quite in remission.  Good appetite with no nausea and vomiting   Review of Systems As per HPI, otherwise negative.  Current Medications, Allergies, Past Medical History, Past Surgical History, Family History and Social History were reviewed in [employer] record.     Objective:   Physical Exam BP 124/72   Pulse 81   Ht 5' 11"  (1.803 m)   Wt 154 lb 12.8 oz (70.2 kg)   SpO2 99%   BMI 21.59 kg/m  Gen: awake, alert, NAD HEENT: anicteric  Abd: soft, NT/ND, +BS throughout Ext: no c/c/e Neuro: nonfocal  Fecal calprotectin 90 on 06/13/2021 --Patient did not mount CRP or ESR elevation during flare previously     Assessment & Plan:  26 year old male with a history of ileocolonic Crohn's disease with perianal  involvement complicated by perianal fistula (diagnosis June 2021; Humira initially stopped after antibody formation; Entyvio induction beginning April 2023) who is here for follow-up.   Ileocolonic Crohn's disease with perianal involvement/history of perianal fistula (prior Humira antibodies; Entyvio initiation April 2023)  --he is doing well.  He still having some intermittent abdominal pain which is likely related to ileitis.  He has only had the induction doses of Entyvio and so it is a bit too early to assess complete response.  My hope is that he will continue to improve.  We discussed that if he does not have induction of remission and we can discuss other therapeutic options which continue to expand daily.  Skyrizi would be a good option for him in the future if needed. --Continue Entyvio with next dose as scheduled on 03/04/2022 --Follow-up with me in late September --At follow-up consider repeat fecal calprotectin plus MR enterography to include pelvis given his history of perianal disease.  20 minutes total spent today including patient facing time, coordination of care, reviewing medical history/procedures/pertinent radiology studies, and documentation of the encounter.

## 2022-02-24 ENCOUNTER — Telehealth: Payer: Self-pay | Admitting: Pharmacy Technician

## 2022-02-24 NOTE — Telephone Encounter (Signed)
Auth Submission: PA RENEWAL - APPROVED Payer: [employer] Medication & CPT/J Code(s) submitted: Entyvio (Vedolizumab) [id number] Route of submission (phone, fax, portal): PORTAL Auth type: Buy/Bill Units/visits requested: 300MG Q8WKS Reference number: [id number] Approval from: 02/24/22 to 02/25/23

## 2022-03-04 ENCOUNTER — Ambulatory Visit (INDEPENDENT_AMBULATORY_CARE_PROVIDER_SITE_OTHER): Payer: 59

## 2022-03-04 VITALS — BP 118/72 | HR 80 | Temp 98.4°F | Resp 16 | Ht 71.0 in | Wt 156.6 lb

## 2022-03-04 DIAGNOSIS — K5000 Crohn's disease of small intestine without complications: Secondary | ICD-10-CM | POA: Diagnosis not present

## 2022-03-04 DIAGNOSIS — K5010 Crohn's disease of large intestine without complications: Secondary | ICD-10-CM | POA: Diagnosis not present

## 2022-03-04 MED ORDER — VEDOLIZUMAB 300 MG IV SOLR
300.0000 mg | Freq: Once | INTRAVENOUS | Status: AC
  Administered 2022-03-04: 300 mg via INTRAVENOUS
  Filled 2022-03-04: qty 5

## 2022-03-04 NOTE — Progress Notes (Signed)
Diagnosis: Crohn's Disease  Provider:  Marshell Garfinkel, MD  Procedure: Infusion  IV Type: Peripheral, IV Location: L Antecubital  Entyvio (Vedolizumab), Dose: 300 mg  Infusion Start Time: 0907  Infusion Stop Time: 0404  Post Infusion IV Care: Peripheral IV Discontinued  Discharge: Condition: Good, Destination: Home . AVS provided to patient.   Performed by:  Cleophus Molt, RN

## 2022-03-06 ENCOUNTER — Other Ambulatory Visit: Payer: Self-pay | Admitting: Pharmacy Technician

## 2022-03-15 IMAGING — CR DG CHEST 2V
2 series · 2 of 2 positions shown · non-contrast
Comparison: None.

CLINICAL DATA: CP

EXAM:
CHEST - 2 VIEW

[chest pa]
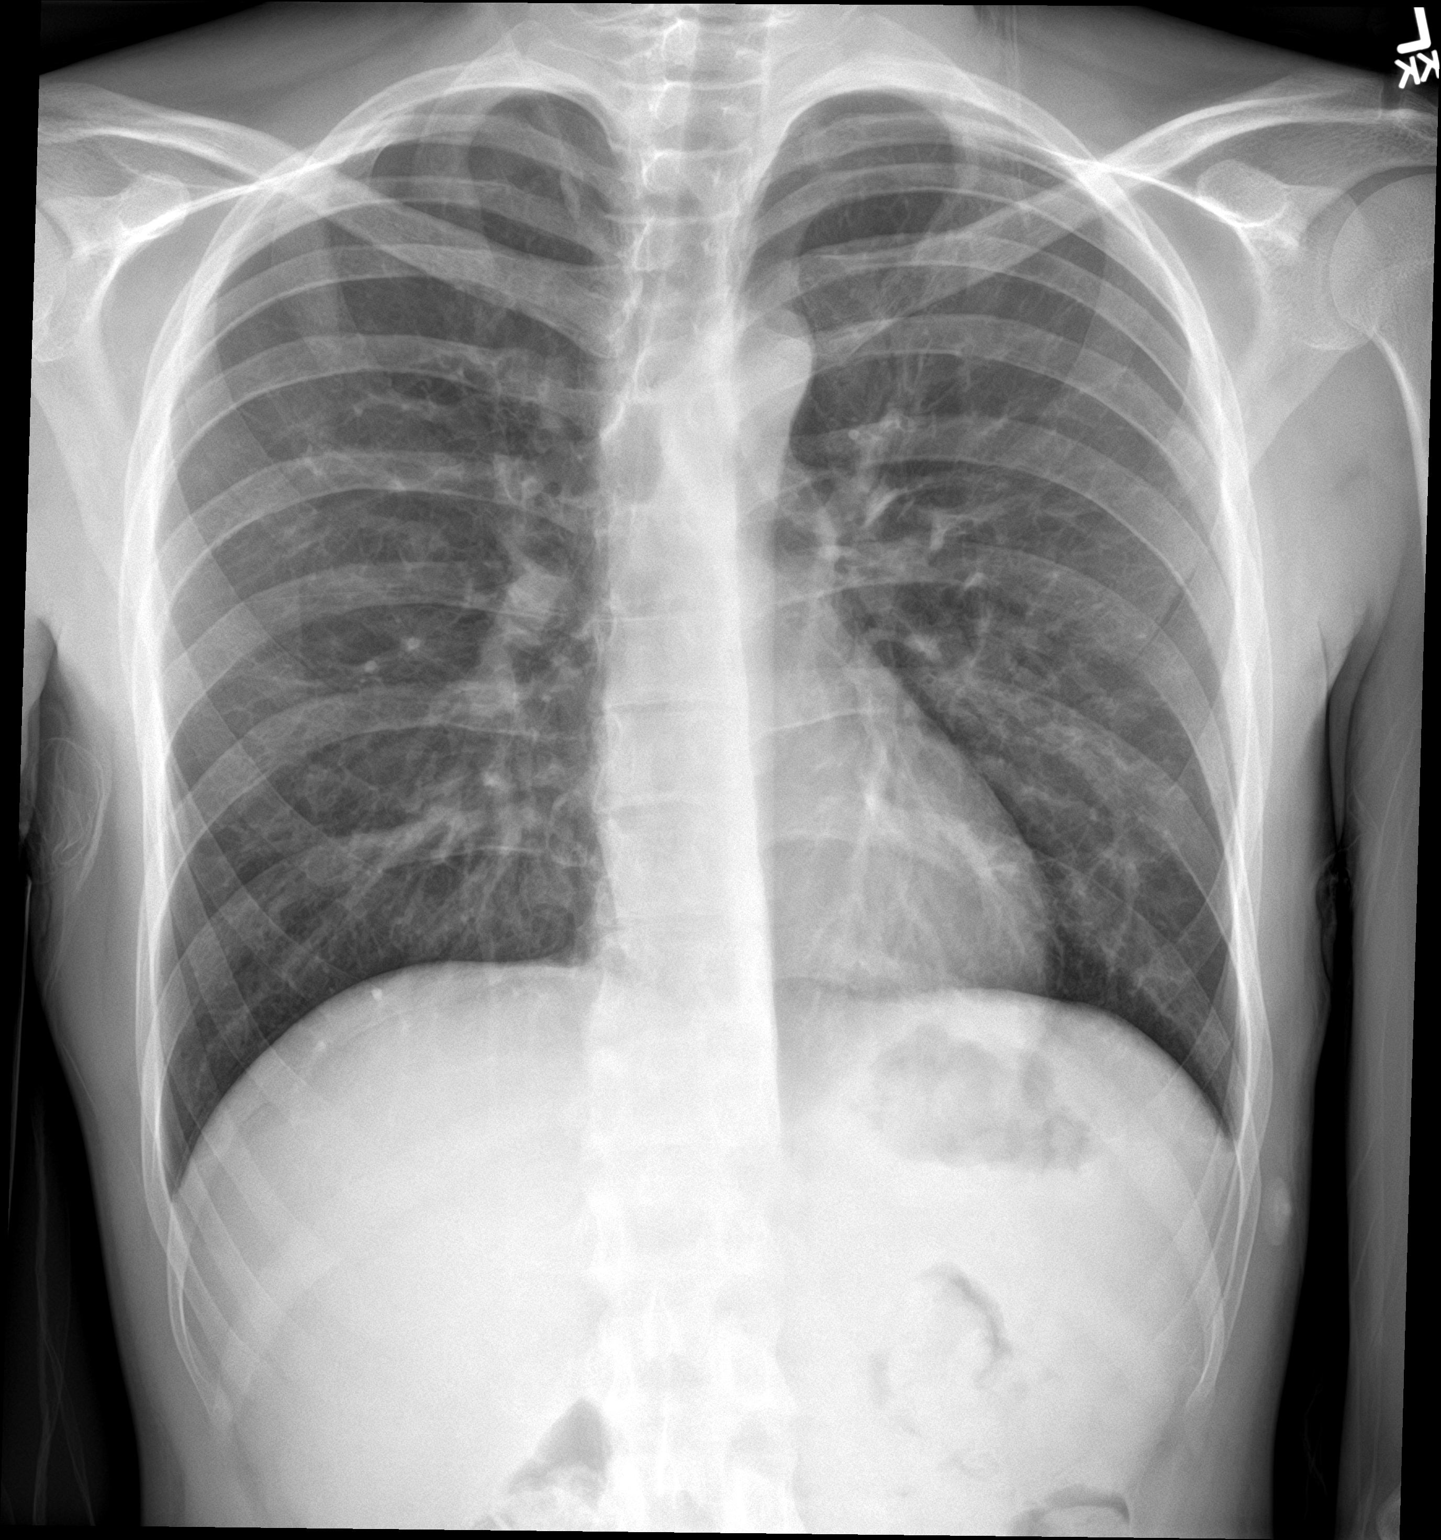

[chest lat]
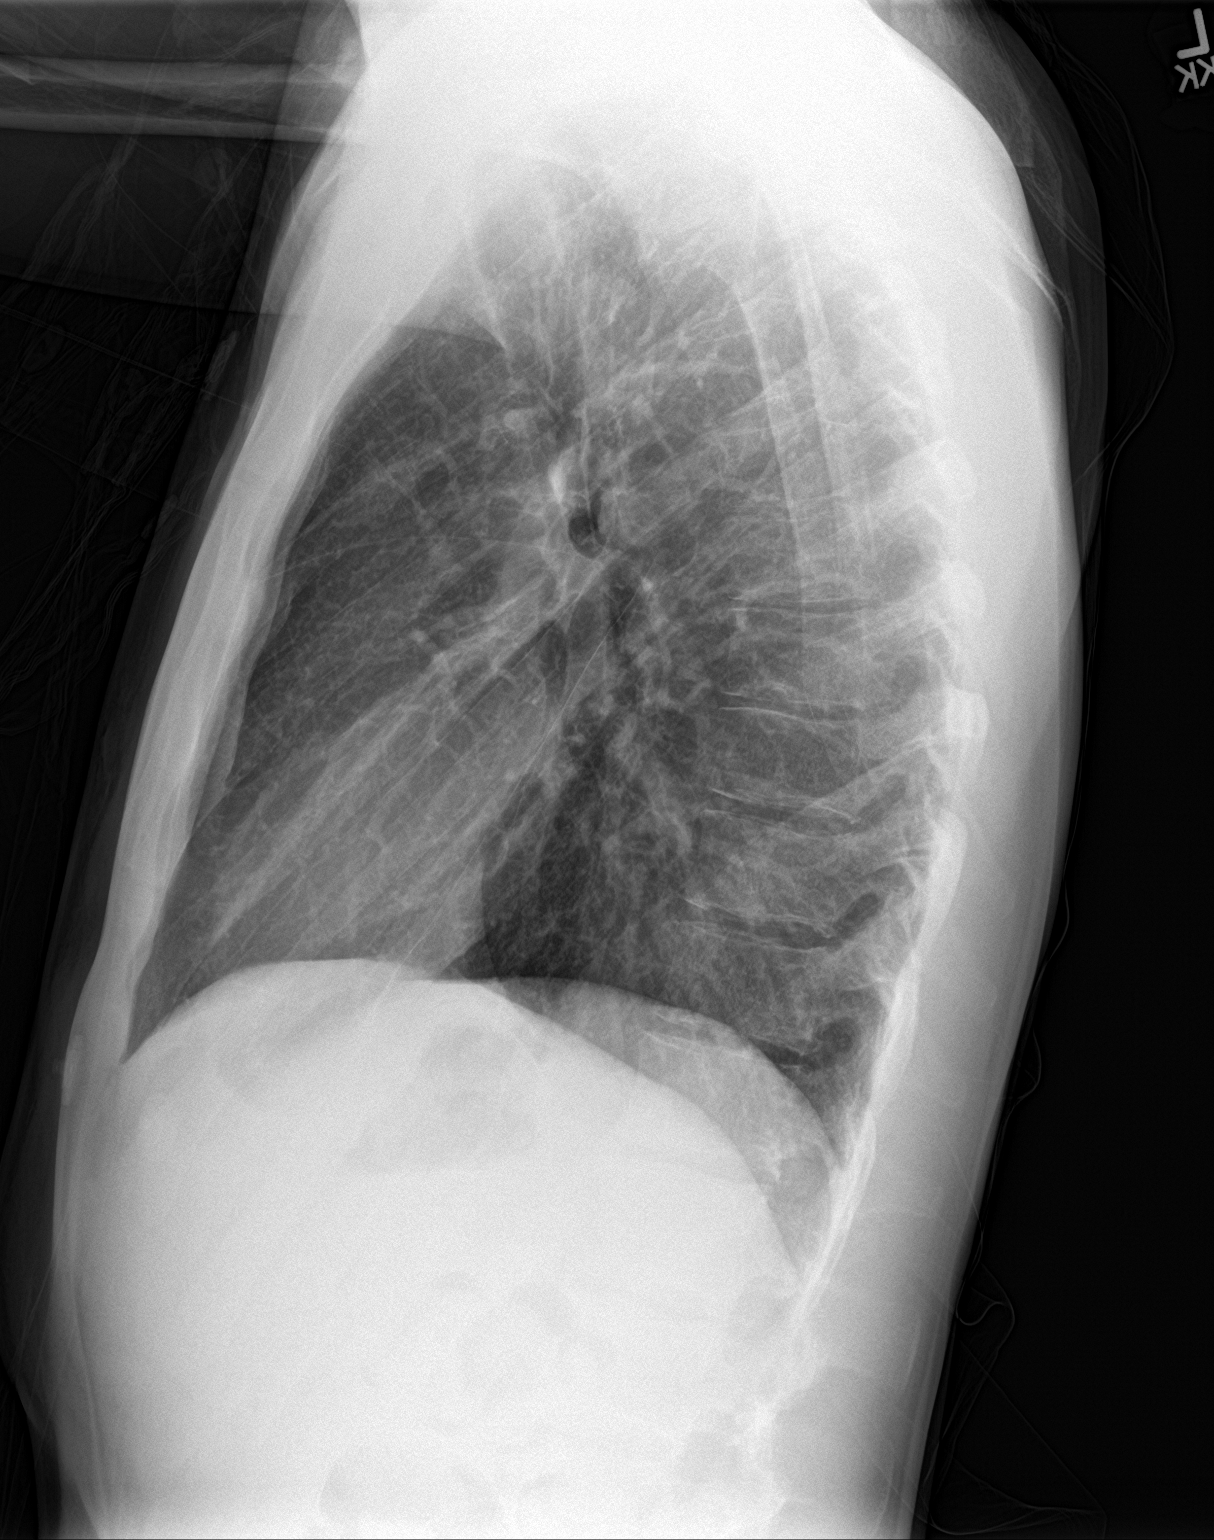

[2 of 2 positions shown; findings below may reference images not displayed]

FINDINGS: The cardiomediastinal silhouette is normal in contour. No pleural
effusion. No pneumothorax. No acute pleuroparenchymal abnormality.
Visualized abdomen is unremarkable. No acute osseous abnormality
noted.
IMPRESSION: No acute cardiopulmonary abnormality.

## 2022-04-16 ENCOUNTER — Encounter: Payer: Self-pay | Admitting: Internal Medicine

## 2022-04-29 ENCOUNTER — Ambulatory Visit (INDEPENDENT_AMBULATORY_CARE_PROVIDER_SITE_OTHER): Payer: 59

## 2022-04-29 VITALS — BP 122/75 | HR 55 | Temp 98.3°F | Resp 16 | Ht 71.0 in | Wt 161.2 lb

## 2022-04-29 DIAGNOSIS — K5000 Crohn's disease of small intestine without complications: Secondary | ICD-10-CM | POA: Diagnosis not present

## 2022-04-29 DIAGNOSIS — K5010 Crohn's disease of large intestine without complications: Secondary | ICD-10-CM | POA: Diagnosis not present

## 2022-04-29 MED ORDER — VEDOLIZUMAB 300 MG IV SOLR
300.0000 mg | Freq: Once | INTRAVENOUS | Status: AC
  Administered 2022-04-29: 300 mg via INTRAVENOUS
  Filled 2022-04-29: qty 5

## 2022-04-29 NOTE — Progress Notes (Signed)
Diagnosis: Crohn's Disease  Provider:  Marshell Garfinkel MD  Procedure: Infusion  IV Type: Peripheral, IV Location: L Antecubital  Entyvio (Vedolizumab), Dose: 300 mg  Infusion Start Time: 0918  Infusion Stop Time: 5844  Post Infusion IV Care: Peripheral IV Discontinued  Discharge: Condition: Good, Destination: Home . AVS provided to patient.   Performed by:  Adelina Mings, LPN

## 2022-05-13 ENCOUNTER — Telehealth: Payer: Self-pay

## 2022-05-13 ENCOUNTER — Encounter: Payer: Self-pay | Admitting: Internal Medicine

## 2022-05-13 ENCOUNTER — Other Ambulatory Visit: Payer: Self-pay

## 2022-05-13 ENCOUNTER — Other Ambulatory Visit: Payer: 59

## 2022-05-13 ENCOUNTER — Ambulatory Visit (INDEPENDENT_AMBULATORY_CARE_PROVIDER_SITE_OTHER): Payer: 59 | Admitting: Internal Medicine

## 2022-05-13 VITALS — BP 110/68 | HR 53 | Ht 71.0 in | Wt 160.0 lb

## 2022-05-13 DIAGNOSIS — K50819 Crohn's disease of both small and large intestine with unspecified complications: Secondary | ICD-10-CM

## 2022-05-13 DIAGNOSIS — K50119 Crohn's disease of large intestine with unspecified complications: Secondary | ICD-10-CM

## 2022-05-13 NOTE — Telephone Encounter (Signed)
Left VM letting patient know to come by the lab and pick up stool studies kit.

## 2022-05-13 NOTE — Progress Notes (Signed)
   Subjective:    Patient ID: Carlos Jackson, male    DOB: [birth year], 26 y.o.   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  He was last seen on 02/11/2022 and he is here alone today.  He reports that he feels like his Crohn's disease overall control has been declining in the last few months.  He is concerned that Entyvio may not be working as well as we had hoped.  He has noticed increase in digestive issues with at times looser stools and lower abdominal cramping.  Still bowel movements only 1 or 2 times per day.  No blood in stool though some infrequent mucus with stool.  He does have some intermittent perianal discomfort but no draining areas or boils.  No fevers.  His weight has been stable.  He does notice some hair loss in the frontal area of the scalp and he feels that he is having some minor memory issues for the last 3 to 6 months.   Review of Systems As per HPI, otherwise negative  Current Medications, Allergies, Past Medical History, Past Surgical History, Family History and Social History were reviewed in [employer] record.    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   Ileocolonic Crohn's disease with perianal involvement/history of perianal  fistula (currently on Entyvio since April 2023, prior Humira antibody formation) --I am concerned that the Weyman Rodney is not improving his disease activity is much as we had hoped.  He has not had definitive new fistula formation that he is having abdominal discomfort, looser stools and intermittent perianal discomfort.  We discussed how Entyvio while very safe is not always effective for every Crohn's patient.  With this in mind we discussed objective testing to see if this matches with his subjective decline as well as consideration of Skyrizi initiation.  We discussed Orson Ape today, how it is dosed, data on efficacy as well as its low risk and high safety profile.  Plan as follows: --Vedolizumab antibody --Fecal calprotectin --MR enterography plus MRI pelvis to assess ileal disease and perianal disease activity --Consider after the above data is reviewed changing Entyvio to [employer] initiation --Follow-up with me in 3 months  30 minutes total spent today including patient facing time, coordination of care, reviewing medical history/procedures/pertinent radiology studies, and documentation of the encounter.

## 2022-05-13 NOTE — Patient Instructions (Signed)
If you are age 27 or older, your body mass index should be between 23-30. Your Body mass index is 22.32 kg/m. If this is out of the aforementioned range listed, please consider follow up with your Primary Care Provider.  If you are age 71 or younger, your body mass index should be between 19-25. Your Body mass index is 22.32 kg/m. If this is out of the aformentioned range listed, please consider follow up with your Primary Care Provider.   Your provider has requested that you go to the basement level for lab work before leaving today. Press "B" on the elevator. The lab is located at the first door on the left as you exit the elevator.  You have been scheduled for an MRI at [facility] on 05/20/22. Your appointment time is 12pm. Please arrive to admitting (at main entrance of the hospital) 15 minutes prior to your appointment time for registration purposes. Please make certain not to have anything to eat or drink 6 hours prior to your test. In addition, if you have any metal in your body, have a pacemaker or defibrillator, please be sure to let your ordering physician know. This test typically takes 45 minutes to 1 hour to complete. Should you need to reschedule, please call [phone] to do so.   The [place] [facility] providers would like to encourage you to use [facility] to communicate with providers for non-urgent requests or questions.  Due to long hold times on the telephone, sending your provider a message by [facility] may be a faster and more efficient way to get a response.  Please allow 48 business hours for a response.  Please remember that this is for non-urgent requests.   It was a pleasure to see you today!  Thank you for trusting me with your gastrointestinal care!    Zenovia Jarred, MD

## 2022-05-18 ENCOUNTER — Telehealth: Payer: Self-pay | Admitting: Internal Medicine

## 2022-05-18 NOTE — Telephone Encounter (Signed)
Pt confused about when he needs to arrive for MR entero of A/P. Called MRI and pt is supposed to arrive at 10:45am to drink contrast there in the MRI dept. He needs to be NPO after 8am, 4 hours prior to the exam. Pt aware.

## 2022-05-18 NOTE — Telephone Encounter (Signed)
Inbound call from patient stating that he is scheduled for an MRI on 10/4 and is requesting a call back to discuss questions. Please advise.

## 2022-05-20 ENCOUNTER — Other Ambulatory Visit: Payer: 59

## 2022-05-20 ENCOUNTER — Ambulatory Visit (HOSPITAL_COMMUNITY)
Admission: RE | Admit: 2022-05-20 | Discharge: 2022-05-20 | Disposition: A | Discharge status: Home / Self Care | Payer: 59 | Source: Ambulatory Visit | Attending: Internal Medicine | Admitting: Internal Medicine

## 2022-05-20 DIAGNOSIS — K50119 Crohn's disease of large intestine with unspecified complications: Secondary | ICD-10-CM

## 2022-05-20 DIAGNOSIS — K50819 Crohn's disease of both small and large intestine with unspecified complications: Secondary | ICD-10-CM | POA: Insufficient documentation

## 2022-05-20 LAB — SERIAL MONITORING

## 2022-05-20 LAB — VEDOLIZUMAB AND ANTI-VEDO AB
Anti-Vedolizumab Antibody: <25 ng/mL
Vedolizumab: 39 ug/mL

## 2022-05-20 MED ORDER — GADOPICLENOL 0.5 MMOL/ML IV SOLN
7.0000 mL | Freq: Once | INTRAVENOUS | Status: AC | PRN
  Administered 2022-05-20: 7 mL via INTRAVENOUS

## 2022-05-22 ENCOUNTER — Encounter: Payer: Self-pay | Admitting: Internal Medicine

## 2022-06-01 ENCOUNTER — Other Ambulatory Visit: Payer: Self-pay

## 2022-06-01 DIAGNOSIS — K5010 Crohn's disease of large intestine without complications: Secondary | ICD-10-CM

## 2022-06-04 NOTE — Telephone Encounter (Signed)
Ok, so no MRI pelvis for now Await fecal Cal

## 2022-06-15 ENCOUNTER — Encounter: Payer: Self-pay | Admitting: Internal Medicine

## 2022-06-17 ENCOUNTER — Other Ambulatory Visit: Payer: 59

## 2022-06-17 DIAGNOSIS — K50819 Crohn's disease of both small and large intestine with unspecified complications: Secondary | ICD-10-CM

## 2022-06-21 LAB — CALPROTECTIN, FECAL: Calprotectin, Fecal: 5 ug/g (ref 0–120)

## 2022-06-22 ENCOUNTER — Encounter: Payer: Self-pay | Admitting: Internal Medicine

## 2022-06-24 ENCOUNTER — Ambulatory Visit (INDEPENDENT_AMBULATORY_CARE_PROVIDER_SITE_OTHER): Payer: 59

## 2022-06-24 VITALS — BP 119/77 | HR 71 | Temp 98.6°F | Resp 16 | Ht 71.0 in | Wt 162.8 lb

## 2022-06-24 DIAGNOSIS — K50119 Crohn's disease of large intestine with unspecified complications: Secondary | ICD-10-CM

## 2022-06-24 DIAGNOSIS — K5000 Crohn's disease of small intestine without complications: Secondary | ICD-10-CM

## 2022-06-24 DIAGNOSIS — K50819 Crohn's disease of both small and large intestine with unspecified complications: Secondary | ICD-10-CM

## 2022-06-24 DIAGNOSIS — K5010 Crohn's disease of large intestine without complications: Secondary | ICD-10-CM

## 2022-06-24 MED ORDER — VEDOLIZUMAB 300 MG IV SOLR
300.0000 mg | Freq: Once | INTRAVENOUS | Status: AC
  Administered 2022-06-24: 300 mg via INTRAVENOUS
  Filled 2022-06-24: qty 5

## 2022-06-24 NOTE — Progress Notes (Signed)
Diagnosis: Crohn's Disease  Provider:  Marshell Garfinkel MD  Procedure: Infusion  IV Type: Peripheral, IV Location: R Antecubital  Entyvio (Vedolizumab), Dose: 300 mg  Infusion Start Time: 0911  Infusion Stop Time: 0233  Post Infusion IV Care: Peripheral IV Discontinued  Discharge: Condition: Good, Destination: Home . AVS provided to patient.   Performed by:  Koren Shiver, RN

## 2022-08-19 ENCOUNTER — Ambulatory Visit (INDEPENDENT_AMBULATORY_CARE_PROVIDER_SITE_OTHER): Payer: 59

## 2022-08-19 VITALS — BP 108/67 | HR 80 | Temp 97.9°F | Resp 16 | Ht 71.0 in | Wt 166.4 lb

## 2022-08-19 DIAGNOSIS — K5000 Crohn's disease of small intestine without complications: Secondary | ICD-10-CM | POA: Diagnosis not present

## 2022-08-19 DIAGNOSIS — K5010 Crohn's disease of large intestine without complications: Secondary | ICD-10-CM | POA: Diagnosis not present

## 2022-08-19 MED ORDER — VEDOLIZUMAB 300 MG IV SOLR
300.0000 mg | Freq: Once | INTRAVENOUS | Status: AC
  Administered 2022-08-19: 300 mg via INTRAVENOUS
  Filled 2022-08-19: qty 5

## 2022-08-19 NOTE — Progress Notes (Signed)
Diagnosis: Crohn's Disease  Provider:  Marshell Garfinkel MD  Procedure: Infusion  IV Type: Peripheral, IV Location: L Antecubital  Entyvio (Vedolizumab), Dose: 300 mg  Infusion Start Time: 1660  Infusion Stop Time: 1011  Post Infusion IV Care: Peripheral IV Discontinued  Discharge: Condition: Good, Destination: Home . AVS provided to patient.   Performed by:  Cleophus Molt, RN

## 2022-10-14 ENCOUNTER — Ambulatory Visit (INDEPENDENT_AMBULATORY_CARE_PROVIDER_SITE_OTHER): Payer: 59

## 2022-10-14 VITALS — BP 104/57 | HR 66 | Temp 98.4°F | Resp 18 | Ht 71.0 in | Wt 167.8 lb

## 2022-10-14 DIAGNOSIS — K50819 Crohn's disease of both small and large intestine with unspecified complications: Secondary | ICD-10-CM

## 2022-10-14 DIAGNOSIS — K5010 Crohn's disease of large intestine without complications: Secondary | ICD-10-CM

## 2022-10-14 DIAGNOSIS — K5000 Crohn's disease of small intestine without complications: Secondary | ICD-10-CM

## 2022-10-14 MED ORDER — VEDOLIZUMAB 300 MG IV SOLR
300.0000 mg | Freq: Once | INTRAVENOUS | Status: AC
  Administered 2022-10-14: 300 mg via INTRAVENOUS
  Filled 2022-10-14: qty 5

## 2022-10-14 NOTE — Progress Notes (Signed)
Diagnosis: Crohn's Disease  Provider:  Marshell Garfinkel MD  Procedure: Infusion  IV Type: Peripheral, IV Location: L Antecubital  Entyvio (Vedolizumab), Dose: 300 mg  Infusion Start Time: 0918  Infusion Stop Time: [id number]  Post Infusion IV Care: Peripheral IV Discontinued  Discharge: Condition: Good, Destination: Home . AVS Declined  Performed by:  Cleophus Molt, RN

## 2022-12-09 ENCOUNTER — Ambulatory Visit (INDEPENDENT_AMBULATORY_CARE_PROVIDER_SITE_OTHER): Payer: 59

## 2022-12-09 VITALS — BP 127/73 | HR 58 | Temp 98.1°F | Resp 18 | Ht 71.0 in | Wt 171.4 lb

## 2022-12-09 DIAGNOSIS — K5000 Crohn's disease of small intestine without complications: Secondary | ICD-10-CM

## 2022-12-09 DIAGNOSIS — K50819 Crohn's disease of both small and large intestine with unspecified complications: Secondary | ICD-10-CM | POA: Diagnosis not present

## 2022-12-09 DIAGNOSIS — K5010 Crohn's disease of large intestine without complications: Secondary | ICD-10-CM

## 2022-12-09 MED ORDER — VEDOLIZUMAB 300 MG IV SOLR
300.0000 mg | Freq: Once | INTRAVENOUS | Status: AC
  Administered 2022-12-09: 300 mg via INTRAVENOUS
  Filled 2022-12-09: qty 5

## 2022-12-09 NOTE — Progress Notes (Signed)
Diagnosis: Crohn's Disease  Provider:  Chilton Greathouse MD  Procedure: IV Infusion  IV Type: Peripheral, IV Location: L Antecubital  Entyvio (Vedolizumab), Dose: 300 mg  Infusion Start Time: 0928  Infusion Stop Time: 1007  Post Infusion IV Care: Peripheral IV Discontinued  Discharge: Condition: Good, Destination: Home . AVS Declined  Performed by:  Garnette Czech, RN

## 2023-01-26 ENCOUNTER — Other Ambulatory Visit: Payer: Self-pay | Admitting: Pharmacy Technician

## 2023-02-03 ENCOUNTER — Other Ambulatory Visit: Payer: Self-pay | Admitting: Pharmacy Technician

## 2023-02-03 ENCOUNTER — Ambulatory Visit (INDEPENDENT_AMBULATORY_CARE_PROVIDER_SITE_OTHER): Payer: 59 | Admitting: *Deleted

## 2023-02-03 VITALS — BP 116/77 | HR 58 | Temp 98.3°F | Resp 12 | Ht 71.0 in | Wt 171.0 lb

## 2023-02-03 DIAGNOSIS — K5010 Crohn's disease of large intestine without complications: Secondary | ICD-10-CM

## 2023-02-03 DIAGNOSIS — K5000 Crohn's disease of small intestine without complications: Secondary | ICD-10-CM

## 2023-02-03 DIAGNOSIS — K50819 Crohn's disease of both small and large intestine with unspecified complications: Secondary | ICD-10-CM

## 2023-02-03 MED ORDER — VEDOLIZUMAB 300 MG IV SOLR
300.0000 mg | Freq: Once | INTRAVENOUS | Status: AC
  Administered 2023-02-03: 300 mg via INTRAVENOUS
  Filled 2023-02-03: qty 5

## 2023-02-03 NOTE — Progress Notes (Signed)
Diagnosis: Crohn's Disease  Provider:  Chilton Greathouse MD  Procedure: IV Infusion  IV Type: Peripheral, IV Location: L Antecubital  Entyvio (Vedolizumab), Dose: 300 mg  Infusion Start Time: 0921 am  Infusion Stop Time: 0958 am  Post Infusion IV Care: Observation period completed and Peripheral IV Discontinued  Discharge: Condition: Good, Destination: Home . AVS Provided  Performed by:  Forrest Moron, RN

## 2023-02-17 ENCOUNTER — Encounter: Payer: Self-pay | Admitting: Internal Medicine

## 2023-03-01 ENCOUNTER — Telehealth: Payer: Self-pay

## 2023-03-01 NOTE — Telephone Encounter (Addendum)
Auth Submission: APPROVED Site of care: Site of care: [facility] Payer: [employer] Medication & CPT/J Code(s) submitted: Entyvio (Vedolizumab) [id number] Route of submission (phone, fax, portal): Portal  Auth type: Buy/Bill Units/visits requested: 300mg  q 8 weeks Reference number: [id number] Approval from: 03/01/2023 to 02/29/2024

## 2023-03-24 ENCOUNTER — Encounter: Payer: Self-pay | Admitting: Internal Medicine

## 2023-03-31 ENCOUNTER — Ambulatory Visit (INDEPENDENT_AMBULATORY_CARE_PROVIDER_SITE_OTHER): Payer: 59

## 2023-03-31 VITALS — BP 111/73 | HR 56 | Temp 98.5°F | Resp 16 | Ht 71.0 in | Wt 170.8 lb

## 2023-03-31 DIAGNOSIS — K5000 Crohn's disease of small intestine without complications: Secondary | ICD-10-CM

## 2023-03-31 DIAGNOSIS — K5010 Crohn's disease of large intestine without complications: Secondary | ICD-10-CM

## 2023-03-31 DIAGNOSIS — K50819 Crohn's disease of both small and large intestine with unspecified complications: Secondary | ICD-10-CM | POA: Diagnosis not present

## 2023-03-31 MED ORDER — VEDOLIZUMAB 300 MG IV SOLR
300.0000 mg | Freq: Once | INTRAVENOUS | Status: AC
  Administered 2023-03-31: 300 mg via INTRAVENOUS
  Filled 2023-03-31: qty 0

## 2023-03-31 NOTE — Progress Notes (Signed)
Diagnosis: Crohn's Disease  Provider:  Chilton Greathouse MD  Procedure: IV Infusion  IV Type: Peripheral, IV Location: L Antecubital  Entyvio (Vedolizumab), Dose: 300 mg  Infusion Start Time: 1538  Infusion Stop Time: 1613  Post Infusion IV Care: Peripheral IV Discontinued  Discharge: Condition: Good, Destination: Home . AVS Declined  Performed by:  Loney Hering, LPN

## 2023-05-06 ENCOUNTER — Other Ambulatory Visit: Payer: Self-pay | Admitting: Pharmacy Technician

## 2023-05-06 ENCOUNTER — Telehealth: Payer: Self-pay | Admitting: Pharmacy Technician

## 2023-05-06 NOTE — Telephone Encounter (Signed)
Smitty Cords, Please enter new tx plan.  Patient has new insurance.  Auth Submission: APPROVED NEW INSURANCE Site of care: Site of care: [facility] Payer: [employer] Medication & CPT/J Code(s) submitted: Entyvio (Vedolizumab) [id number] Route of submission (phone, fax, portal): PORTAL Phone # Fax # Auth type: Buy/Bill PB Units/visits requested: 300MG  Q8WKS Reference number: [id number] Approval from: 05/06/23 to 05/05/24   [facility] Subscriber ID [id number] Group ID [id number]

## 2023-05-19 ENCOUNTER — Encounter: Payer: Self-pay | Admitting: Internal Medicine

## 2023-05-26 ENCOUNTER — Encounter: Payer: Self-pay | Admitting: Internal Medicine

## 2023-05-26 ENCOUNTER — Ambulatory Visit: Payer: 59

## 2023-05-26 ENCOUNTER — Telehealth: Payer: Self-pay | Admitting: Internal Medicine

## 2023-05-26 NOTE — Telephone Encounter (Signed)
Inbound call from patient stating he is scheduled for an entry  infusion today and believes that he may be sick and has questions regarding the infusion. Please advise.

## 2023-05-26 NOTE — Telephone Encounter (Signed)
Pt due for entyvio today. States he had a cold last week and when he got to work today he started feeling like he had a sinus infection. He called the infusion center and they decided to move the appt to Friday if he is feeling better. Requested pt call us on Friday to give [place] an update. Pt stated he will do so. Dr. Rhea Belton notified.

## 2023-05-28 ENCOUNTER — Ambulatory Visit: Payer: 59

## 2023-05-28 VITALS — BP 118/75 | HR 55 | Temp 98.3°F | Resp 16 | Ht 71.0 in | Wt 171.0 lb

## 2023-05-28 DIAGNOSIS — K50819 Crohn's disease of both small and large intestine with unspecified complications: Secondary | ICD-10-CM

## 2023-05-28 DIAGNOSIS — K5010 Crohn's disease of large intestine without complications: Secondary | ICD-10-CM

## 2023-05-28 DIAGNOSIS — K5000 Crohn's disease of small intestine without complications: Secondary | ICD-10-CM

## 2023-05-28 MED ORDER — VEDOLIZUMAB 300 MG IV SOLR
300.0000 mg | Freq: Once | INTRAVENOUS | Status: AC
  Administered 2023-05-28: 300 mg via INTRAVENOUS
  Filled 2023-05-28: qty 5

## 2023-05-28 NOTE — Telephone Encounter (Signed)
See note below pt had moved his infusion out a couple of days and is feeling better wanted to let us know he plans to have entyvio today.

## 2023-05-28 NOTE — Progress Notes (Signed)
Diagnosis: Crohn's Disease  Provider:  Chilton Greathouse MD  Procedure: IV Infusion  IV Type: Peripheral, IV Location: L Antecubital  Entyvio (Vedolizumab), Dose: 300 mg  Infusion Start Time: 1537  Infusion Stop Time: 1614  Post Infusion IV Care: Patient declined observation and Peripheral IV Discontinued  Discharge: Condition: Stable, Destination: Home . AVS Declined  Performed by:  Nat Math, RN

## 2023-05-28 NOTE — Telephone Encounter (Signed)
Inbound call from patient stating that he rescheduled his infusion for today at 3:30. Patient states he has been fever free for 24 hours and just wanted to let us know. Please advise.

## 2023-07-12 ENCOUNTER — Telehealth: Payer: Self-pay | Admitting: Pharmacy Technician

## 2023-07-12 NOTE — Telephone Encounter (Signed)
Spoke with patient in regard to new insurance information. He has an upcoming appt on 07/23/23. We will need new insurance info to submit new auth. Patient states he will call back with updated information.  Selena Batten

## 2023-07-13 ENCOUNTER — Encounter: Payer: Self-pay | Admitting: Internal Medicine

## 2023-07-13 ENCOUNTER — Telehealth: Payer: Self-pay | Admitting: Pharmacy Technician

## 2023-07-13 NOTE — Telephone Encounter (Signed)
Have scheduled him to see Mike Gip PA 07/19/23 at 9am.

## 2023-07-13 NOTE — Telephone Encounter (Signed)
Thanks [employer].  Selena Batten

## 2023-07-13 NOTE — Telephone Encounter (Addendum)
Bonita Quin,  Patient has new insurance and a new Berkley Harvey has been submitted. BCBS: ID: [id number] GR: : [id number] Phone: [phone]  Insurance will request updated chart notes.  Last OV = 05/13/22. Patient will need to be scheduled for f/u appt as soon as possible.  Next entyvio treatment: 04/23/23.

## 2023-07-19 ENCOUNTER — Encounter: Payer: Self-pay | Admitting: Internal Medicine

## 2023-07-19 ENCOUNTER — Other Ambulatory Visit (INDEPENDENT_AMBULATORY_CARE_PROVIDER_SITE_OTHER): Payer: Self-pay

## 2023-07-19 ENCOUNTER — Ambulatory Visit (INDEPENDENT_AMBULATORY_CARE_PROVIDER_SITE_OTHER): Payer: BC Managed Care – PPO | Admitting: Physician Assistant

## 2023-07-19 ENCOUNTER — Encounter: Payer: Self-pay | Admitting: Physician Assistant

## 2023-07-19 VITALS — BP 118/70 | HR 55 | Ht 71.0 in | Wt 174.4 lb

## 2023-07-19 DIAGNOSIS — K50019 Crohn's disease of small intestine with unspecified complications: Secondary | ICD-10-CM | POA: Diagnosis not present

## 2023-07-19 DIAGNOSIS — Z79620 Long term (current) use of immunosuppressive biologic: Secondary | ICD-10-CM | POA: Diagnosis not present

## 2023-07-19 DIAGNOSIS — K5080 Crohn's disease of both small and large intestine without complications: Secondary | ICD-10-CM

## 2023-07-19 DIAGNOSIS — Z79899 Other long term (current) drug therapy: Secondary | ICD-10-CM | POA: Diagnosis not present

## 2023-07-19 LAB — CBC WITH DIFFERENTIAL/PLATELET
Basophils Absolute: 0 10*3/uL (ref 0.0–0.1)
Basophils Relative: 0.9 % (ref 0.0–3.0)
Eosinophils Absolute: 0.2 10*3/uL (ref 0.0–0.7)
Eosinophils Relative: 4.7 % (ref 0.0–5.0)
HCT: 47.7 % (ref 39.0–52.0)
Hemoglobin: 16.4 g/dL (ref 13.0–17.0)
Lymphocytes Relative: 32.9 % (ref 12.0–46.0)
Lymphs Abs: 1.3 10*3/uL (ref 0.7–4.0)
MCHC: 34.3 g/dL (ref 30.0–36.0)
MCV: 87.8 fL (ref 78.0–100.0)
Monocytes Absolute: 0.3 10*3/uL (ref 0.1–1.0)
Monocytes Relative: 8.8 % (ref 3.0–12.0)
Neutro Abs: 2 10*3/uL (ref 1.4–7.7)
Neutrophils Relative %: 52.7 % (ref 43.0–77.0)
Platelets: 174 10*3/uL (ref 150.0–400.0)
RBC: 5.44 Mil/uL (ref 4.22–5.81)
RDW: 13 % (ref 11.5–15.5)
WBC: 3.9 10*3/uL — ABNORMAL LOW (ref 4.0–10.5)

## 2023-07-19 LAB — COMPREHENSIVE METABOLIC PANEL
ALT: 52 U/L (ref 0–53)
AST: 26 U/L (ref 0–37)
Albumin: 4.7 g/dL (ref 3.5–5.2)
Alkaline Phosphatase: 84 U/L (ref 39–117)
BUN: 20 mg/dL (ref 6–23)
CO2: 29 meq/L (ref 19–32)
Calcium: 9.6 mg/dL (ref 8.4–10.5)
Chloride: 103 meq/L (ref 96–112)
Creatinine, Ser: 1.01 mg/dL (ref 0.40–1.50)
GFR: 102.21 mL/min (ref >60.00)
Glucose, Bld: 80 mg/dL (ref 70–99)
Potassium: 4.4 meq/L (ref 3.5–5.1)
Sodium: 140 meq/L (ref 135–145)
Total Bilirubin: 0.6 mg/dL (ref 0.2–1.2)
Total Protein: 7.9 g/dL (ref 6.0–8.3)

## 2023-07-19 LAB — VITAMIN B12: Vitamin B-12: 442 pg/mL (ref 211–911)

## 2023-07-19 NOTE — Progress Notes (Signed)
Subjective:    Patient ID: Carlos Jackson, male    DOB: [birth year], 27 y.o.   MRN: [id number]  HPI Carlos Jackson is a pleasant 27 year old white male, established with Dr. Rhea Jackson.  He has history of Crohn's ileocolitis and comes in today for routine follow-up.  He was last seen in the office in September 2023.  He had initiated Entyvio in April 2023.  Last imaging October 2023 with MR enterography showed no evidence of bowel wall thickening distention or inflammatory changes, there was a large amount of stool.  The low rectum/anus was not well imaged on this exam, mild splenomegaly noted. At that time Entyvio antibody level was negative and fecal calprotectin as of November 2023 was normal at 5. Patient says that he continues on every 8 week Entyvio infusions and has been doing well over the past year. He says that he does notice usually during week 7-8 that he will develop some fatigue and generalized achiness but generally does not have any active GI symptoms during this time.  The symptoms seem to be more prominent in the summer months and less of an issue in the colder months.  He will usually feel better within 24 hours of his infusion. He is not having any problems with abdominal pain or cramping, no diarrhea no recent changes in bowel habits no melena or hematochezia.  Appetite has been good, weight has been stable.  He denies any current anorectal symptoms.  Colonoscopy had last been done December 2022-4 mm polyp in the descending colon, there was inflammation moderate in severity with erosions erythema and ulcerations in the terminal ileum and in the ileocecal valve, the proximal rectum mid rectum sigmoid descending transverse and ascending colon all appeared normal there was an area of mildly erythematous mucosa at the anus and in the distal rectum and scarring from prior active Crohn's.  Review of Systems Pertinent positive and negative review of systems were noted in the above HPI section.  All  other review of systems was otherwise negative.   Outpatient Encounter Medications as of 07/19/2023  Medication Sig   montelukast (SINGULAIR) 10 MG tablet Take 10 mg by mouth at bedtime.   Probiotic Product (PROBIOTIC PO) Take 1 tablet by mouth 2 (two) times daily.   vedolizumab (ENTYVIO) 300 MG injection Inject into the vein.   No facility-administered encounter medications on file as of 07/19/2023.   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 Carlos Jackson family history includes Anxiety disorder in his mother; Breast cancer in his maternal grandmother; Crohn's disease in his cousin; Depression in his mother; Heart block in his maternal grandfather; Hypertension in his maternal grandfather; OCD in his mother.      Objective:    Vitals:   07/19/23 0859  BP: 118/70  Pulse: (!) 55  SpO2: 100%    Physical Exam Well-developed  well-nourished  in no acute distress.  Height, [id number],956  BMI 24.3  HEENT; nontraumatic normocephalic, EOMI, PE R LA, sclera anicteric. Oropharynx; not examined today Neck; supple, no JVD Cardiovascular; regular rate and rhythm with S1-S2, no murmur rub or gallop Pulmonary; Clear bilaterally Abdomen; soft, nontender, nondistended, no palpable mass or hepatosplenomegaly, bowel sounds are active Rectal; not done today Skin; benign exam, no jaundice rash or appreciable lesions Extremities; no clubbing cyanosis or edema skin warm and dry Neuro/Psych; alert and oriented x4, grossly nonfocal mood and affect appropriate        Assessment & Plan:   #48 27 year old white male with Crohn's ileocolitis, with previous anorectal involvement. He has been on Entyvio since April 2023 and has done very well. Imaging with MR enterography October 2023 did not show any evidence of active disease. Fecal calprotectin November 2023 normal Entyvio antibody level - fall 2023  Patient continues to do well over the past year with no active GI symptoms.  He does notice some fatigue and achiness that develops in weeks 7-8 just prior to being due for his next Entyvio infusion.  The symptoms usually resolve within 24 hours of an infusion.  He has not had any active GI symptoms during that time. Also notices these symptoms seem to be prominent more so in the summer months.  Plan; continue Entyvio infusions every 8 weeks.  We discussed the possibility of decreasing the interval between infusions but he does not feel that is necessary at this time, and will monitor his symptoms.  Labs today to include CBC with differential, c-Met, B12 level, QuantiFERON gold and fecal calprotectin.  Entyvio needs prior Auth for 2025, notes indicate this is in process.  He will follow-up with Dr. Rhea Jackson April /May 2025, and can discuss potential shortening of the interval between infusions as symptoms seem to be more prominent in the  summer.  Happy to see him in the interim for any problems.   Carlos Jackson Carlos Hillock PA-C 07/19/2023   Cc: Carlos Rud, MD

## 2023-07-19 NOTE — Patient Instructions (Signed)
Your provider has requested that you go to the basement level for lab work before leaving today. Press "B" on the elevator. The lab is located at the first door on the left as you exit the elevator.  Continue Entyvio   _______________________________________________________  If your blood pressure at your visit was 140/90 or greater, please contact your primary care physician to follow up on this.  _______________________________________________________  If you are age 27 or older, your body mass index should be between 23-30. Your Body mass index is 24.32 kg/m. If this is out of the aforementioned range listed, please consider follow up with your Primary Care Provider.  If you are age 61 or younger, your body mass index should be between 19-25. Your Body mass index is 24.32 kg/m. If this is out of the aformentioned range listed, please consider follow up with your Primary Care Provider.   ________________________________________________________  The [facility] providers would like to encourage you to use [facility] to communicate with providers for non-urgent requests or questions.  Due to long hold times on the telephone, sending your provider a message by [facility] may be a faster and more efficient way to get a response.  Please allow 48 business hours for a response.  Please remember that this is for non-urgent requests.  _______________________________________________________ It was a pleasure to see you today!  Thank you for trusting me with your gastrointestinal care!

## 2023-07-21 LAB — QUANTIFERON-TB GOLD PLUS
Mitogen-NIL: >10 [IU]/mL
NIL: 0.03 [IU]/mL
QuantiFERON-TB Gold Plus: NEGATIVE
TB1-NIL: 0.01 [IU]/mL
TB2-NIL: 0 [IU]/mL

## 2023-07-21 NOTE — Progress Notes (Signed)
Addendum: Reviewed and agree with assessment and management plan. Kadijah Shamoon M, MD  

## 2023-07-22 ENCOUNTER — Encounter: Payer: Self-pay | Admitting: Internal Medicine

## 2023-07-22 ENCOUNTER — Other Ambulatory Visit (HOSPITAL_COMMUNITY): Payer: Self-pay

## 2023-07-22 NOTE — Telephone Encounter (Addendum)
Fyi note: NEW INSURANCE - ENTYVIO APPROVAL.  Auth Submission: APPROVED Site of care: Site of care: [facility] Payer: BCBS Medication & CPT/J Code(s) submitted: Entyvio (Vedolizumab) [id number] Route of submission (phone, fax, portal):  Phone # [phone] Fax #[phone] Auth type: Buy/Bill PB Units/visits requested: 2400 UNITS 300MG  Q8WKS REF:  [id number] Approval from: 07/21/23 to 07/20/24

## 2023-07-23 ENCOUNTER — Ambulatory Visit (INDEPENDENT_AMBULATORY_CARE_PROVIDER_SITE_OTHER): Payer: BC Managed Care – PPO

## 2023-07-23 VITALS — BP 104/66 | HR 55 | Temp 97.6°F | Resp 16 | Ht 71.0 in | Wt 175.4 lb

## 2023-07-23 DIAGNOSIS — K5000 Crohn's disease of small intestine without complications: Secondary | ICD-10-CM

## 2023-07-23 DIAGNOSIS — K5010 Crohn's disease of large intestine without complications: Secondary | ICD-10-CM

## 2023-07-23 DIAGNOSIS — K50819 Crohn's disease of both small and large intestine with unspecified complications: Secondary | ICD-10-CM

## 2023-07-23 MED ORDER — VEDOLIZUMAB 300 MG IV SOLR
300.0000 mg | Freq: Once | INTRAVENOUS | Status: AC
  Administered 2023-07-23: 300 mg via INTRAVENOUS
  Filled 2023-07-23: qty 5

## 2023-07-23 NOTE — Progress Notes (Signed)
Diagnosis: Crohn's Disease  Provider:  Chilton Greathouse MD  Procedure: IV Infusion  IV Type: Peripheral, IV Location: L Antecubital  Entyvio (Vedolizumab), Dose: 300 mg  Infusion Start Time: 1551  Infusion Stop Time: 1626  Post Infusion IV Care: Patient declined observation and Peripheral IV Discontinued  Discharge: Condition: Stable, Destination: Home . AVS Declined  Performed by:  Rico Ala, LPN

## 2023-08-16 ENCOUNTER — Other Ambulatory Visit: Payer: BC Managed Care – PPO

## 2023-08-16 DIAGNOSIS — K50019 Crohn's disease of small intestine with unspecified complications: Secondary | ICD-10-CM

## 2023-08-21 LAB — PANCREATIC ELASTASE, FECAL: Pancreatic Elastase-1, Stool: 670 ug/g (ref >200)

## 2023-08-24 ENCOUNTER — Other Ambulatory Visit: Payer: Self-pay

## 2023-08-24 DIAGNOSIS — K50019 Crohn's disease of small intestine with unspecified complications: Secondary | ICD-10-CM

## 2023-08-25 ENCOUNTER — Other Ambulatory Visit: Payer: Self-pay

## 2023-08-30 ENCOUNTER — Telehealth: Payer: Self-pay | Admitting: Physician Assistant

## 2023-08-30 NOTE — Telephone Encounter (Signed)
 Patient called and stated that he was wondering if there is a infusion center closer to his home that he could possible go to. Patient also stated that he would like a call back. Please advise.

## 2023-08-31 NOTE — Telephone Encounter (Signed)
 I dont think there is anything in [place]. Unless he got set up with [facility] and they could do it in the home maybe.

## 2023-08-31 NOTE — Telephone Encounter (Signed)
 Pyrtle patient on Entyvio. He was last seen by Mike Gip, PA-C for his yearly labs and visit.  Patient lives in [place].  Do you know of anything closer?

## 2023-08-31 NOTE — Telephone Encounter (Signed)
 Called the patient. Left information on his voicemail. Asked he call back if he wanted to pursue in home infusions or if he knew of a different infusion center that would be more convenient for him.

## 2023-09-16 ENCOUNTER — Telehealth: Payer: Self-pay

## 2023-09-16 NOTE — Telephone Encounter (Signed)
Received call from [facility]. Pt is schedule for entyvio tomorrow and reports he has a sinus infection, has been prescribed antibiotics but has not picked them up yet. The infusion center wants to know if his infusion needs to be pushed out or if he is ok to proceed with infusion tomorrow. Spoke with Engineer, mining at [phone]. Please advise.

## 2023-09-16 NOTE — Telephone Encounter (Signed)
Spoke with infusion center and they are aware of recommendations per Dr. Rhea Belton.

## 2023-09-16 NOTE — Telephone Encounter (Signed)
Would delay by 7 days, assuming improvement as expected he can proceed 1 week late Thanks JMP

## 2023-09-16 NOTE — Telephone Encounter (Signed)
[facility] called follow up on the information below. [facility] is requesting a call back. Please advise.

## 2023-09-17 ENCOUNTER — Ambulatory Visit: Payer: BC Managed Care – PPO

## 2023-09-24 ENCOUNTER — Ambulatory Visit (INDEPENDENT_AMBULATORY_CARE_PROVIDER_SITE_OTHER): Payer: BC Managed Care – PPO

## 2023-09-24 VITALS — BP 108/56 | HR 63 | Temp 97.7°F | Resp 18 | Ht 71.0 in | Wt 171.6 lb

## 2023-09-24 DIAGNOSIS — K5010 Crohn's disease of large intestine without complications: Secondary | ICD-10-CM

## 2023-09-24 DIAGNOSIS — K50819 Crohn's disease of both small and large intestine with unspecified complications: Secondary | ICD-10-CM | POA: Diagnosis not present

## 2023-09-24 DIAGNOSIS — K5000 Crohn's disease of small intestine without complications: Secondary | ICD-10-CM

## 2023-09-24 MED ORDER — VEDOLIZUMAB 300 MG IV SOLR
300.0000 mg | Freq: Once | INTRAVENOUS | Status: AC
  Administered 2023-09-24: 300 mg via INTRAVENOUS
  Filled 2023-09-24: qty 5

## 2023-09-24 NOTE — Progress Notes (Signed)
 Diagnosis: Crohn's Disease  Provider:  Praveen Mannam MD  Procedure: IV Infusion  IV Type: Peripheral, IV Location: L Antecubital  Entyvio  (Vedolizumab ), Dose: 300 mg  Infusion Start Time: 1536  Infusion Stop Time: 1612  Post Infusion IV Care: Observation period completed and Peripheral IV Discontinued  Discharge: Condition: Good, Destination: Home . AVS Declined  Performed by:  Maximiano JONELLE Pouch, LPN

## 2023-10-18 ENCOUNTER — Other Ambulatory Visit: Payer: Self-pay

## 2023-10-18 NOTE — Telephone Encounter (Signed)
 He has noticed some achiness and return of Crohn's type symptoms at about 1 to 2 weeks before each infusion  He should submit the fecal calprotectin but also okay to increase the Entyvio to every 6 weeks I have included Amalia Greenhouse to try to work on the new dosing interval from a pharmacy perspective JMP

## 2023-10-18 NOTE — Telephone Encounter (Signed)
 I will submit a new for q6wk dosing.  I will f/u once I have a response from the insurance.  Carlos Jackson

## 2023-10-19 ENCOUNTER — Other Ambulatory Visit: Payer: Self-pay | Admitting: Internal Medicine

## 2023-10-19 NOTE — Telephone Encounter (Addendum)
 Dr. Rhea Belton, He has been approved for q6wk dosing. We will schedule him 6wks from his last dose of 09/24/23.  Auth Submission: APPROVED Site of care: Site of care: [facility] Payer: BCBS Medication & CPT/J Code(s) submitted: Entyvio (Vedolizumab) [id number] Route of submission (phone, fax, portal):  Phone #[phone] Fax #[phone] Auth type: Buy/Bill PB Units/visits requested:300MG  Q6WKS.  2700 units - 9 doses Reference number: [id number] Approval from: 10/18/23 to 10/17/24   @[email or web address] ,  Please enter a new treatment plan for q6wks dosing.  Thanks Selena Batten

## 2023-11-05 ENCOUNTER — Ambulatory Visit

## 2023-11-05 VITALS — BP 116/74 | HR 62 | Temp 97.6°F | Resp 12 | Ht 71.0 in | Wt 173.4 lb

## 2023-11-05 DIAGNOSIS — K5000 Crohn's disease of small intestine without complications: Secondary | ICD-10-CM

## 2023-11-05 DIAGNOSIS — K50819 Crohn's disease of both small and large intestine with unspecified complications: Secondary | ICD-10-CM | POA: Diagnosis not present

## 2023-11-05 DIAGNOSIS — K5010 Crohn's disease of large intestine without complications: Secondary | ICD-10-CM

## 2023-11-05 MED ORDER — VEDOLIZUMAB 300 MG IV SOLR
300.0000 mg | Freq: Once | INTRAVENOUS | Status: AC
  Administered 2023-11-05: 300 mg via INTRAVENOUS
  Filled 2023-11-05: qty 5

## 2023-11-05 NOTE — Progress Notes (Signed)
 Diagnosis: Crohn's Disease  Provider:  Chilton Greathouse MD  Procedure: IV Infusion  IV Type: Peripheral, IV Location: L Antecubital  Entyvio (Vedolizumab), Dose: 300 mg  Infusion Start Time: 1607  Infusion Stop Time: 1640  Post Infusion IV Care: Peripheral IV Discontinued  Discharge: Condition: Good, Destination: Home . AVS Declined  Performed by:  Loney Hering, LPN

## 2023-11-19 ENCOUNTER — Ambulatory Visit: Payer: BC Managed Care – PPO

## 2023-12-17 ENCOUNTER — Ambulatory Visit (INDEPENDENT_AMBULATORY_CARE_PROVIDER_SITE_OTHER)

## 2023-12-17 VITALS — BP 111/66 | HR 57 | Temp 98.2°F | Resp 16 | Ht 71.0 in | Wt 176.4 lb

## 2023-12-17 DIAGNOSIS — K50819 Crohn's disease of both small and large intestine with unspecified complications: Secondary | ICD-10-CM | POA: Diagnosis not present

## 2023-12-17 DIAGNOSIS — K5010 Crohn's disease of large intestine without complications: Secondary | ICD-10-CM

## 2023-12-17 DIAGNOSIS — K5000 Crohn's disease of small intestine without complications: Secondary | ICD-10-CM

## 2023-12-17 MED ORDER — VEDOLIZUMAB 300 MG IV SOLR
300.0000 mg | Freq: Once | INTRAVENOUS | Status: AC
  Administered 2023-12-17: 300 mg via INTRAVENOUS
  Filled 2023-12-17: qty 5

## 2023-12-17 NOTE — Progress Notes (Signed)
 Diagnosis: Crohn's Disease  Provider:  Praveen Mannam MD  Procedure: IV Infusion  IV Type: Peripheral, IV Location: R Antecubital  Entyvio  (Vedolizumab ), Dose: 300 mg  Infusion Start Time: 1609  Infusion Stop Time: 1648  Post Infusion IV Care: Peripheral IV Discontinued  Discharge: Condition: Good, Destination: Home . AVS Declined  Performed by:  Lauran Pollard, LPN

## 2024-01-21 ENCOUNTER — Telehealth: Payer: Self-pay | Admitting: Internal Medicine

## 2024-01-21 ENCOUNTER — Ambulatory Visit: Payer: Self-pay

## 2024-01-21 NOTE — Telephone Encounter (Signed)
 Pt has had issues with a sinus infection and drainage that he felt led to strep throat. He was treated but is still having issues. Not happy with PCP and is between docs now. Has an appt in the am with urgent care. Pt is supposed to have entyvio  infusion next Friday 6/13. Pt has also scheduled an appt with Colleen on Monday. Discussed with pt that it sounds like he is doing what he needs to do but his infusion may need to be moved out depending on how he is doing. Note sent to Dr Beatris Bough to let him know what is going on with pt.

## 2024-01-21 NOTE — Telephone Encounter (Signed)
 Inbound call from patient states he has soreness in his throat . Requesting to speak with a nurse. Please advise.

## 2024-01-21 NOTE — Telephone Encounter (Signed)
 This RN made first attempt to triage patient. No answer, unable to LVM due to full VMB. Routing for additional attempts.   Copied From CRM [id number]. Reason for Triage: Patient calling establishing care with Royston Cornea "Margarie Shay NP patient unsure of who last PCP was  Patient has sore throat now and on, 01/08/24  strep throat, Patient phone [phone]

## 2024-01-21 NOTE — Telephone Encounter (Signed)
  FYI Only or Action Required?: FYI only for provider  Patient was last seen in primary care on not established with [facility] practice. Called Nurse Triage reporting Sore Throat. Symptoms began several weeks ago. Interventions attempted: Prescription medications: Z-pak. Symptoms are: gradually worsening.  Triage Disposition: See PCP When Office is Open (Within 3 Days)  Patient/caregiver understands and will follow disposition?: Yes                           Reason for Disposition  [1] Sore throat with cough/cold symptoms AND [2] present > 5 days  Answer Assessment - Initial Assessment Questions 1. ONSET: "When did the throat start hurting?" (Hours or days ago)      1-2 weeks ago, was treated for strep, symptoms got better (never fully went away) and are worsening again 2. SEVERITY: "How bad is the sore throat?" (Scale 1-10; mild, moderate or severe)   - MILD (1-3):  Doesn't interfere with eating or normal activities.   - MODERATE (4-7): Interferes with eating some solids and normal activities.   - SEVERE (8-10):  Excruciating pain, interferes with most normal activities.   - SEVERE WITH DYSPHAGIA (10): Can't swallow liquids, drooling.     Moderate- denies severe pain and difficulty swallowing 3. STREP EXPOSURE: "Has there been any exposure to strep within the past week?" If Yes, ask: "What type of contact occurred?"      Yes, was treated for strep 1-2 weeks ago 4.  VIRAL SYMPTOMS: "Are there any symptoms of a cold, such as a runny nose, cough, hoarse voice or red eyes?"      Cough and congestion 5. FEVER: "Do you have a fever?" If Yes, ask: "What is your temperature, how was it measured, and when did it start?"     Denies 6. PUS ON THE TONSILS: "Is there pus on the tonsils in the back of your throat?"     States back of throat appears very red and inflamed 7. OTHER SYMPTOMS: "Do you have any other symptoms?" (e.g., difficulty breathing, headache, rash)     Denies  headache, denies rash, denies difficulty breathing, denies difficulty swallowing  Protocols used: Sore Throat-A-AH

## 2024-01-22 ENCOUNTER — Ambulatory Visit
Admission: RE | Admit: 2024-01-22 | Discharge: 2024-01-22 | Disposition: A | Discharge status: Home / Self Care | Payer: Self-pay | Source: Ambulatory Visit | Attending: Emergency Medicine | Admitting: Emergency Medicine

## 2024-01-22 VITALS — BP 125/78 | HR 62 | Temp 97.8°F | Resp 16

## 2024-01-22 DIAGNOSIS — J019 Acute sinusitis, unspecified: Secondary | ICD-10-CM

## 2024-01-22 DIAGNOSIS — J029 Acute pharyngitis, unspecified: Secondary | ICD-10-CM | POA: Diagnosis not present

## 2024-01-22 DIAGNOSIS — L709 Acne, unspecified: Secondary | ICD-10-CM | POA: Insufficient documentation

## 2024-01-22 LAB — POCT RAPID STREP A (OFFICE): Rapid Strep A Screen: NEGATIVE

## 2024-01-22 MED ORDER — DOXYCYCLINE HYCLATE 100 MG PO CAPS: 100.0000 mg | ORAL_CAPSULE | Freq: Two times a day (BID) | ORAL | 0 refills | Status: DC

## 2024-01-22 NOTE — ED Triage Notes (Addendum)
 Patient presents to [facility] for continued sore throat. Treated with antibiotics with no improvement.

## 2024-01-22 NOTE — Discharge Instructions (Signed)
 Rapid strep test is negative for bacteria to the throat, do believe symptoms are related to residual sinus infection  Symptoms present for a month most likely bacteria causing symptoms to linger  Take doxycycline twice daily for 10 days    You can take Tylenol and/or Ibuprofen  as needed for fever reduction and pain relief.   For cough: honey 1/2 to 1 teaspoon (you can dilute the honey in water or another fluid).  You can also use guaifenesin and dextromethorphan for cough. You can use a humidifier for chest congestion and cough.  If you don't have a humidifier, you can sit in the bathroom with the hot shower running.      For sore throat: try warm salt water gargles, cepacol lozenges, throat spray, warm tea or water with lemon/honey, popsicles or ice, or OTC cold relief medicine for throat discomfort.   For congestion: take a daily anti-histamine like Zyrtec, Claritin, and a oral decongestant, such as pseudoephedrine.  You can also use Flonase 1-2 sprays in each nostril daily.   It is important to stay hydrated: drink plenty of fluids (water, gatorade/powerade/pedialyte, juices, or teas) to keep your throat moisturized and help further relieve irritation/discomfort.

## 2024-01-22 NOTE — ED Provider Notes (Signed)
 Arlander Bellman    CSN: [id number] Arrival date & time: 01/22/24  4098      History   Chief Complaint Chief Complaint  Patient presents with   Sore Throat    I am not sure if I have strep throat or some other kind of infection. I was prescribed an antibiotic by my Primary and it got a little better, but it is getting worse again. My throat is very red and raw looking but is not really sore. - Entered by patient    HPI Devynn Scheff is a 28 y.o. male.   Patient presents for evaluation of persisting nasal congestion and a productive cough of light green sputum present for 1 month.  Started experiencing a sore throat 2 weeks ago, progressively worsening.  Noticed Crimson Dubberly exudate, bumps to the throat and redness but endorses only mild pain.  Was given a Z-Pak by his primary doctor and symptoms did improve but have not resolved.  Denies fever.  Endorses over the past month he did experience what he believes a spider bite to be to the right side of the neck, experienced redness and swelling that has resolved.  Unsure if related.  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 Authorizing Provider  doxycycline (VIBRAMYCIN) 100 MG capsule Take 1 capsule (100 mg total) by  mouth 2 (two) times daily. 01/22/24  Yes Chasin Findling R, NP  montelukast (SINGULAIR) 10 MG tablet Take 10 mg by mouth at bedtime.    [provider]  Probiotic Product (PROBIOTIC PO) Take 1 tablet by mouth 2 (two) times daily.    [provider]  vedolizumab  (ENTYVIO ) 300 MG injection Inject into the vein.    [provider]    Family History Family History  Problem Relation Age of Onset   Depression Mother    Anxiety disorder Mother    OCD Mother    Breast cancer Maternal Grandmother    Hypertension Maternal Grandfather    Heart block Maternal Grandfather    Crohn's disease Cousin    Colon cancer Neg Hx    Esophageal cancer Neg Hx    Stomach cancer Neg Hx    Rectal cancer Neg Hx     Social History Social History   Tobacco Use   Smoking status: Never   Smokeless tobacco: Never  Vaping Use   Vaping status: Never Used  Substance Use Topics   Alcohol use: Not Currently   Drug use: Never     Allergies   Humira  [adalimumab ]   Review of Systems Review of Systems   Physical Exam Triage Vital Signs ED  Triage Vitals  Encounter Vitals Group     BP 01/22/24 0956 125/78     Systolic BP Percentile --      Diastolic BP Percentile --      Pulse Rate 01/22/24 0956 62     Resp 01/22/24 0956 16     Temp 01/22/24 0956 97.8 F (36.6 C)     Temp Source 01/22/24 0956 Temporal     SpO2 01/22/24 0956 97 %     Weight --      Height --      Head Circumference --      Peak Flow --      Pain Score 01/22/24 1027 0     Pain Loc --      Pain Education --      Exclude from Growth Chart --    No data found.  Updated Vital Signs BP 125/78 (BP Location: Left Arm)   Pulse 62   Temp 97.8 F (36.6 C) (Temporal)   Resp 16   SpO2 97%   Visual Acuity Right Eye Distance:   Left Eye Distance:   Bilateral Distance:    Right Eye Near:   Left Eye Near:    Bilateral Near:     Physical Exam Constitutional:      Appearance: Normal appearance.  HENT:      Head: Normocephalic.     Right Ear: Tympanic membrane, ear canal and external ear normal.     Left Ear: Tympanic membrane and external ear normal.     Nose: Congestion present.     Mouth/Throat:     Pharynx: Posterior oropharyngeal erythema present. No oropharyngeal exudate.  Cardiovascular:     Rate and Rhythm: Normal rate and regular rhythm.     Pulses: Normal pulses.     Heart sounds: Normal heart sounds.  Pulmonary:     Effort: Pulmonary effort is normal.     Breath sounds: Normal breath sounds.  Musculoskeletal:     Cervical back: Normal range of motion and neck supple.  Neurological:     Mental Status: He is alert and oriented to person, place, and time. Mental status is at baseline.      UC Treatments / Results  Labs (all labs ordered are listed, but only abnormal results are displayed) Labs Reviewed  POCT RAPID STREP A (OFFICE) - Normal    EKG   Radiology No results found.  Procedures Procedures (including critical care time)  Medications Ordered in UC Medications - No data to display  Initial Impression / Assessment and Plan / UC Course  I have reviewed the triage vital signs and the nursing notes.  Pertinent labs & imaging results that were available during my care of the patient were reviewed by me and considered in my medical decision making (see chart for details).  Acute nonrecurrent sinusitis, sore throat  Patient is in no signs of distress nor toxic appearing.  Vital signs are stable.  Low suspicion for pneumonia, pneumothorax or bronchitis and therefore will defer imaging.  Rapid strep test negative.  Symptoms persisting for 1 month without full resolution most likely bacterial at this time, prescribed doxycycline.May use additional over-the-counter medications as needed for supportive care.  May follow-up with urgent care as needed if symptoms persist or worsen.    Final Clinical Impressions(s) / UC Diagnoses   Final diagnoses:  Acute  non-recurrent sinusitis, unspecified location  Sore throat   Discharge Instructions      Rapid strep test is negative  for bacteria to the throat, do believe symptoms are related to residual sinus infection  Symptoms present for a month most likely bacteria causing symptoms to linger  Take doxycycline twice daily for 10 days    You can take Tylenol and/or Ibuprofen  as needed for fever reduction and pain relief.   For cough: honey 1/2 to 1 teaspoon (you can dilute the honey in water or another fluid).  You can also use guaifenesin and dextromethorphan for cough. You can use a humidifier for chest congestion and cough.  If you don't have a humidifier, you can sit in the bathroom with the hot shower running.      For sore throat: try warm salt water gargles, cepacol lozenges, throat spray, warm tea or water with lemon/honey, popsicles or ice, or OTC cold relief medicine for throat discomfort.   For congestion: take a daily anti-histamine like Zyrtec, Claritin, and a oral decongestant, such as pseudoephedrine.  You can also use Flonase 1-2 sprays in each nostril daily.   It is important to stay hydrated: drink plenty of fluids (water, gatorade/powerade/pedialyte, juices, or teas) to keep your throat moisturized and help further relieve irritation/discomfort.   ED Prescriptions     Medication Sig Dispense Auth. Provider   doxycycline (VIBRAMYCIN) 100 MG capsule Take 1 capsule (100 mg total) by mouth 2 (two) times daily. 20 capsule Josette Shimabukuro R, NP      PDMP not reviewed this encounter.   Reena Canning, NP 01/22/24 1058

## 2024-01-23 NOTE — Progress Notes (Unsigned)
 01/23/2024 Carlos Jackson [id number] 1996/07/07   Chief Complaint: Crohn's disease follow up  History of Present Illness: Carlos Jackson is a 28 year old male with a history of Crohn's ileocolitis. Initiated on Enyvio 11/2021. He is  known by Dr. Bridgett Camps.   Pt has had issues with a sinus infection and drainage that he felt led to strep throat. He was treated but is still having issues. Not happy with PCP and is between docs now. Has an appt in the am with urgent care. Pt is supposed to have entyvio  infusion next Friday 6/13. Pt has also scheduled an appt with Jarek Longton on Monday. Discussed with pt that it sounds like he is doing what he needs to do but his infusion may need to be moved out depending on how he is doing. Note sent to Dr Beatris Bough to let him know what is going on with pt.          Latest Ref Rng & Units 07/19/2023    9:38 AM 06/05/2021    3:18 PM 05/11/2021    2:57 PM  CBC  WBC 4.0 - 10.5 K/uL 3.9  5.2  9.0   Hemoglobin 13.0 - 17.0 g/dL 01.0  27.2  53.6   Hematocrit 39.0 - 52.0 % 47.7  44.1  44.7   Platelets 150.0 - 400.0 K/uL 174.0  158.0  162        Latest Ref Rng & Units 07/19/2023    9:38 AM 06/05/2021    3:18 PM 05/11/2021    2:57 PM  CMP  Glucose 70 - 99 mg/dL 80  79  99   BUN 6 - 23 mg/dL 20  19  18    Creatinine 0.40 - 1.50 mg/dL 6.44  0.34  7.42   Sodium 135 - 145 mEq/L 140  141  141   Potassium 3.5 - 5.1 mEq/L 4.4  4.0  4.3   Chloride 96 - 112 mEq/L 103  103  102   CO2 19 - 32 mEq/L 29  30  27    Calcium 8.4 - 10.5 mg/dL 9.6  9.3  9.6   Total Protein 6.0 - 8.3 g/dL 7.9  7.9    Total Bilirubin 0.2 - 1.2 mg/dL 0.6  0.5    Alkaline Phos 39 - 117 U/L 84  78    AST 0 - 37 U/L 26  22    ALT 0 - 53 U/L 52  22      Colonoscopy 07/25/2021: - Scarring in the anal canal palpable on digital rectal exam.  - Crohn's disease with ileitis. Biopsied.  - One 4 mm polyp in the descending colon, removed with a cold snare. Resected and retrieved.  - The proximal rectum, mid  rectum, sigmoid colon, descending colon, transverse colon, ascending colon and cecum are normal. Much improved compared to last colonoscopy.  - Erythematous mucosa at the anus and in the distal rectum. Biopsied to assess for Crohn's activity in the distal rectum/anal canal.  - No fistulae seen today. 1. Surgical [P], colon, ileal biopsy CHRONIC INFLAMMATION WITH CRYPTITIS AND CRYPT DISTORTION SUSPICIOUS OF INFLAMMATORY BOWEL DISEASE IN MINIMALLY ACTIVE PHASE, BIOPSY OF ILEOCECAL JUNCTION. PLEASE SEE COMMENT. COMMENT; IN VIEW OF PATIENT'S HISTORY OF CROHN'S DISEASE THIS MOST LIKELY REPRESENTS CROHN'S DISEASE AND MINIMALLY ACTIVE PHASE. 2. Surgical [P], colon, descending, polyp (1) INFLAMMATORY PSEUDOPOLYP. NEGATIVE FOR ADENOMATOUS CHANGE, DYSPLASIA AND MALIGNANCY. 3. Surgical [P], colon, rectal-anal MILD CHRONIC COLITIS WITH PSEUDOPOLYP FORMATION SUSPICIOUS OF PARTIALLY HEALED PHASE OF  CROHN'S DISEASE. NEGATIVE FOR DYSPLASIA AND MALIGNANCY  Colonoscopy 01/30/2020: - Granularity and palpable fibrotic changes found on digital rectal exam. - Ileitis, consistent with Crohn's disease. Biopsied.  - The ascending colon and cecum are normal. Biopsied.  - Findings are consistent with Crohn's disease. Inflammation was found in the anus, in the rectum, in the sigmoid colon, in the descending colon and in the transverse colon. This was moderate in severity. Biopsied.  - One 7 mm polyp in the distal rectum, removed with a hot snare. Resected and retrieved.  1. Surgical [P], small bowel, terminal ileum - MODERATELY ACTIVE CHRONIC ILEITIS. SEE NOTE - NEGATIVE FOR GRANULOMAS OR DYSPLASIA 2. Surgical [P], colon, ascending - INACTIVE CHRONIC COLITIS WITH A NONNECROTIZING EPITHELIOID CELL GRANULOMA. SEE NOTE - NEGATIVE FOR DYSPLASIA 3. Surgical [P], colon, transverse - MILDLY ACTIVE CHRONIC COLITIS WITH MULTIPLE NONNECROTIZING EPITHELIOID CELLS MICROGRANULOMAS. SEE NOTE - NEGATIVE FOR DYSPLASIA 4.  Surgical [P], colon, sigmoid and descending - FOCALLY ACTIVE CHRONIC COLITIS WITH A FEW NONNECROTIZING EPITHELIOID CELLS MICROGRANULOMAS. SEE NOTE - NEGATIVE FOR DYSPLASIA 5. Surgical [P], colon, rectum, polyp - INFLAMMATORY POLYP -NEGATIVE FOR DYSPLASIA 6. Surgical [P], colon, distal rectum - MILDLY ACTIVE CHRONIC PROCTITIS WITH MULTIPLE NONNECROTIZING EPITHELIOID CELLS GRANULOMAS. SEE NOTE - NEGATIVE FOR DYSPLASIA    Current Medications, Allergies, Past Medical History, Past Surgical History, Family History and Social History were reviewed in [employer] record.   Review of Systems:   Constitutional: Negative for fever, sweats, chills or weight loss.  Respiratory: Negative for shortness of breath.   Cardiovascular: Negative for chest pain, palpitations and leg swelling.  Gastrointestinal: See HPI.  Musculoskeletal: Negative for back pain or muscle aches.  Neurological: Negative for dizziness, headaches or paresthesias.    Physical Exam: There were no vitals taken for this visit. General: in no acute distress. Head: Normocephalic and atraumatic. Eyes: No scleral icterus. Conjunctiva pink . Ears: Normal auditory acuity. Mouth: Dentition intact. No ulcers or lesions.  Lungs: Clear throughout to auscultation. Heart: Regular rate and rhythm, no murmur. Abdomen: Soft, nontender and nondistended. No masses or hepatomegaly. Normal bowel sounds x 4 quadrants.  Rectal: Deferred.  Musculoskeletal: Symmetrical with no gross deformities. Extremities: No edema. Neurological: Alert oriented x 4. No focal deficits.  Psychological: Alert and cooperative. Normal mood and affect  Assessment and Recommendations:  28 year old male with Crohn's ileocolitis on Entyvio  since 11/2021 -CBC with diff, CMET, CRP, fecal calprotectin and Quantiferon gold

## 2024-01-24 ENCOUNTER — Other Ambulatory Visit (INDEPENDENT_AMBULATORY_CARE_PROVIDER_SITE_OTHER)

## 2024-01-24 ENCOUNTER — Ambulatory Visit: Admitting: Nurse Practitioner

## 2024-01-24 ENCOUNTER — Ambulatory Visit: Payer: Self-pay | Admitting: Nurse Practitioner

## 2024-01-24 ENCOUNTER — Encounter: Payer: Self-pay | Admitting: Nurse Practitioner

## 2024-01-24 VITALS — BP 90/58 | HR 56 | Ht 70.5 in | Wt 173.2 lb

## 2024-01-24 DIAGNOSIS — K50818 Crohn's disease of both small and large intestine with other complication: Secondary | ICD-10-CM

## 2024-01-24 DIAGNOSIS — K5010 Crohn's disease of large intestine without complications: Secondary | ICD-10-CM | POA: Diagnosis not present

## 2024-01-24 DIAGNOSIS — J328 Other chronic sinusitis: Secondary | ICD-10-CM

## 2024-01-24 DIAGNOSIS — J028 Acute pharyngitis due to other specified organisms: Secondary | ICD-10-CM

## 2024-01-24 LAB — COMPREHENSIVE METABOLIC PANEL WITH GFR
ALT: 44 U/L (ref 0–53)
AST: 23 U/L (ref 0–37)
Albumin: 5 g/dL (ref 3.5–5.2)
Alkaline Phosphatase: 73 U/L (ref 39–117)
BUN: 18 mg/dL (ref 6–23)
CO2: 25 meq/L (ref 19–32)
Calcium: 9.5 mg/dL (ref 8.4–10.5)
Chloride: 104 meq/L (ref 96–112)
Creatinine, Ser: 1.02 mg/dL (ref 0.40–1.50)
GFR: 100.64 mL/min (ref >60.00)
Glucose, Bld: 91 mg/dL (ref 70–99)
Potassium: 4 meq/L (ref 3.5–5.1)
Sodium: 142 meq/L (ref 135–145)
Total Bilirubin: 0.6 mg/dL (ref 0.2–1.2)
Total Protein: 7.9 g/dL (ref 6.0–8.3)

## 2024-01-24 LAB — CBC WITH DIFFERENTIAL/PLATELET
Basophils Absolute: 0 10*3/uL (ref 0.0–0.1)
Basophils Relative: 0.9 % (ref 0.0–3.0)
Eosinophils Absolute: 0.2 10*3/uL (ref 0.0–0.7)
Eosinophils Relative: 4.2 % (ref 0.0–5.0)
HCT: 46.4 % (ref 39.0–52.0)
Hemoglobin: 16.2 g/dL (ref 13.0–17.0)
Lymphocytes Relative: 31.2 % (ref 12.0–46.0)
Lymphs Abs: 1.2 10*3/uL (ref 0.7–4.0)
MCHC: 34.9 g/dL (ref 30.0–36.0)
MCV: 84.4 fl (ref 78.0–100.0)
Monocytes Absolute: 0.3 10*3/uL (ref 0.1–1.0)
Monocytes Relative: 8 % (ref 3.0–12.0)
Neutro Abs: 2.2 10*3/uL (ref 1.4–7.7)
Neutrophils Relative %: 55.7 % (ref 43.0–77.0)
Platelets: 174 10*3/uL (ref 150.0–400.0)
RBC: 5.5 Mil/uL (ref 4.22–5.81)
RDW: 12.3 % (ref 11.5–15.5)
WBC: 3.9 10*3/uL — ABNORMAL LOW (ref 4.0–10.5)

## 2024-01-24 LAB — SEDIMENTATION RATE: Sed Rate: 1 mm/h (ref 0–15)

## 2024-01-24 LAB — HIGH SENSITIVITY CRP: CRP, High Sensitivity: 0.93 mg/L (ref 0.000–5.000)

## 2024-01-24 NOTE — Patient Instructions (Addendum)
 Apply a small amount of Desitin inside the anal opening and to the external anal area three times daily as needed for anal or hemorrhoidal irritation/bleeding.     Your provider has requested that you go to the basement level for lab work before leaving today. Press "B" on the elevator. The lab is located at the first door on the left as you exit the elevator.   Due to recent changes in healthcare laws, you may see the results of your imaging and laboratory studies on MyChart before your provider has had a chance to review them.  We understand that in some cases there may be results that are confusing or concerning to you. Not all laboratory results come back in the same time frame and the provider may be waiting for multiple results in order to interpret others.  Please give us  48 hours in order for your provider to thoroughly review all the results before contacting the office for clarification of your results.   Referral for ENT has been placed, you will see it in the after visit summery referrals   I appreciate the  opportunity to care for you  Thank You   [facility]

## 2024-01-25 ENCOUNTER — Other Ambulatory Visit

## 2024-01-25 DIAGNOSIS — K5010 Crohn's disease of large intestine without complications: Secondary | ICD-10-CM

## 2024-01-25 NOTE — Progress Notes (Signed)
 I spoke with Carlos Jackson and I discussed Dr. Marietta Shorter input is listed in his addendum below.  Patient will proceed with ENT consult, await ENT recommendations.  I discussed possibly changing Entyvio  to an alternative biologic if his recurrent sinusitis/pharyngitis seems to be due to Entyvio .  Patient stated feeling well at this time therefore he will proceed with his Entyvio  infusion on Friday, 01/28/2024 as scheduled.  He will contact our office if he develops any abdominal pain, rectal bleeding or diarrhea.

## 2024-01-25 NOTE — Progress Notes (Signed)
 Addendum: Reviewed and agree with assessment and management plan. Vedolizumab  may be contributing to his episodes of sinusitis.  I agree with getting an ENT consult to get their opinion. We may need to consider a different biologic medication for his Crohn's disease including Crohn's with perianal fistula if he continues to have sinusitis Other options would be Skyrizi, Tremfya, Stelara If he is feeling otherwise well okay to have his infusion on 6/13 as scheduled Clarance Bollard, Amber Bail, MD

## 2024-01-27 LAB — CALPROTECTIN, FECAL: Calprotectin, Fecal: <5 ug/g (ref 0–120)

## 2024-01-28 ENCOUNTER — Ambulatory Visit (INDEPENDENT_AMBULATORY_CARE_PROVIDER_SITE_OTHER)

## 2024-01-28 VITALS — BP 106/68 | HR 58 | Temp 98.1°F | Resp 18 | Ht 70.5 in | Wt 174.8 lb

## 2024-01-28 DIAGNOSIS — K5010 Crohn's disease of large intestine without complications: Secondary | ICD-10-CM

## 2024-01-28 DIAGNOSIS — K5000 Crohn's disease of small intestine without complications: Secondary | ICD-10-CM

## 2024-01-28 DIAGNOSIS — K50819 Crohn's disease of both small and large intestine with unspecified complications: Secondary | ICD-10-CM | POA: Diagnosis not present

## 2024-01-28 MED ORDER — VEDOLIZUMAB 300 MG IV SOLR
300.0000 mg | Freq: Once | INTRAVENOUS | Status: AC
  Administered 2024-01-28: 300 mg via INTRAVENOUS
  Filled 2024-01-28: qty 5

## 2024-01-28 NOTE — Progress Notes (Signed)
 Diagnosis: Crohn's Disease  Provider:  Praveen Mannam MD  Procedure: IV Infusion  IV Type: Peripheral, IV Location: L Antecubital  Entyvio  (Vedolizumab ), Dose: 300 mg  Infusion Start Time: 1606  Infusion Stop Time: 1641  Post Infusion IV Care: Peripheral IV Discontinued  Discharge: Condition: Good, Destination: Home . AVS Declined  Performed by:  Meshawn Oconnor, RN

## 2024-02-01 LAB — SERIAL MONITORING

## 2024-02-02 LAB — VEDOLIZUMAB AND ANTI-VEDO AB
Anti-Vedolizumab Antibody: <25 ng/mL
Vedolizumab: 25 ug/mL

## 2024-03-10 ENCOUNTER — Ambulatory Visit

## 2024-03-10 VITALS — BP 109/73 | HR 69 | Temp 98.0°F | Resp 18 | Ht 71.0 in | Wt 175.0 lb

## 2024-03-10 DIAGNOSIS — K50019 Crohn's disease of small intestine with unspecified complications: Secondary | ICD-10-CM | POA: Diagnosis not present

## 2024-03-10 DIAGNOSIS — K5010 Crohn's disease of large intestine without complications: Secondary | ICD-10-CM

## 2024-03-10 DIAGNOSIS — K5000 Crohn's disease of small intestine without complications: Secondary | ICD-10-CM

## 2024-03-10 MED ORDER — VEDOLIZUMAB 300 MG IV SOLR
300.0000 mg | Freq: Once | INTRAVENOUS | Status: AC
  Administered 2024-03-10: 300 mg via INTRAVENOUS
  Filled 2024-03-10: qty 5

## 2024-03-10 NOTE — Progress Notes (Signed)
 Diagnosis: Crohn's Disease  Provider:  Praveen Mannam MD  Procedure: IV Infusion  IV Type: Peripheral, IV Location: L Antecubital  Entyvio  (Vedolizumab ), Dose: 300 mg  Infusion Start Time: 1607  Infusion Stop Time: 1637  Post Infusion IV Care: Peripheral IV Discontinued  Discharge: Condition: Good, Destination: Home . AVS Declined  Performed by:  Leita FORBES Miles, LPN

## 2024-04-05 ENCOUNTER — Encounter (INDEPENDENT_AMBULATORY_CARE_PROVIDER_SITE_OTHER): Payer: Self-pay | Admitting: Otolaryngology

## 2024-04-05 ENCOUNTER — Ambulatory Visit (INDEPENDENT_AMBULATORY_CARE_PROVIDER_SITE_OTHER): Admitting: Otolaryngology

## 2024-04-05 VITALS — BP 135/78 | HR 63

## 2024-04-05 DIAGNOSIS — J343 Hypertrophy of nasal turbinates: Secondary | ICD-10-CM

## 2024-04-05 DIAGNOSIS — J3089 Other allergic rhinitis: Secondary | ICD-10-CM

## 2024-04-05 DIAGNOSIS — R430 Anosmia: Secondary | ICD-10-CM | POA: Diagnosis not present

## 2024-04-05 DIAGNOSIS — J342 Deviated nasal septum: Secondary | ICD-10-CM | POA: Diagnosis not present

## 2024-04-05 DIAGNOSIS — J329 Chronic sinusitis, unspecified: Secondary | ICD-10-CM | POA: Diagnosis not present

## 2024-04-05 DIAGNOSIS — R0982 Postnasal drip: Secondary | ICD-10-CM | POA: Diagnosis not present

## 2024-04-05 DIAGNOSIS — R0981 Nasal congestion: Secondary | ICD-10-CM

## 2024-04-05 DIAGNOSIS — J3489 Other specified disorders of nose and nasal sinuses: Secondary | ICD-10-CM

## 2024-04-05 MED ORDER — LEVOCETIRIZINE DIHYDROCHLORIDE 5 MG PO TABS: 5.0000 mg | ORAL_TABLET | Freq: Every evening | ORAL | 3 refills | Status: DC

## 2024-04-05 MED ORDER — FLUTICASONE PROPIONATE 50 MCG/ACT NA SUSP
2.0000 | Freq: Two times a day (BID) | NASAL | 6 refills | Status: DC
Start: 2024-04-05 — End: 2024-05-23

## 2024-04-05 NOTE — Progress Notes (Signed)
 ENT CONSULT:  Reason for Consult: chronic nasal congestion and post-nasal drainage   HPI: Discussed the use of AI scribe software for clinical note transcription with the patient, who gave verbal consent to proceed.  History of Present Illness Carlos Jackson is a 28 year old male with Crohn's disease who presents with chronic sinus issues and chronic nasal congestion.  He has been experiencing recurrent sinus infections since last year, with the first episode occurring at the end of the school year. These infections have been accompanied by significant drainage leading to throat infections characterized by sores, although strep throat was ruled out. Initial antibiotic treatment provided temporary relief, but symptoms recurred shortly after completing the course. A second, longer course of antibiotics resolved the throat issues, but drainage persists, particularly in the mornings.  He experiences anosmia, and his nasal congestion can occur on one side of the nose and can happen daily or multiple times a day. He has not undergone sinus surgery and has not been tested for allergies previously. He can only breathe out of one side of his nose at a time, which sometimes shifts from one side to the other.  He is currently on Entyvio  for Crohn's disease, which he receives as an infusion every six weeks. His Crohn's symptoms have been manageable since increasing the frequency of his infusions about a year ago, and he has not experienced a flare-up in over a year.  Records Reviewed:  GI office visit Elida Shawl History of Present Illness: Carlos Jackson is a 28 year old male with a history of Crohn's ileocolitis with prior perianal fistula initially diagnosed per colonoscopy 01/2020.  Initiated on Enyvio 11/2021. He is  known by Dr. Albertus. He presents today for further evaluation regarding possible Crohn's symptoms after being treated for a sinus infection and strep throat. He endorses being diagnosed  with a sinus infection in Oct 2024, Jan or Feb 202025 and 12/2023. His most recent sinus infection was one month ago, he did not take an antibiotic at that time. Two weeks later, he developed a sore throat and he saw white dots in the back of his throat. He contacted his PCP who prescribed a course a Zpak for suspected strep throat. His symptoms persisted therefore he went to the urgent care clinic 01/22/2024 and a strep test was negative. He described having green post nasal drainage with a small amount of blood and it was suspected that he had a sinus infection therefore he was prescribed  Doxycycline  100 mg twice daily for 10 days, today is day #3.  He endorsed having some abdominal pain after swallowing postnasal drainage as well as after taking his Z-Pak.  He stated he is tolerating Doxycycline  better than the Z-Pak and denies having any abdominal pain at this time.  He had mouth sores related to Crohn's disease in the past, not recently.  He also has environmental allergies for which he previously took Singulair, stopped taking it recently after he had his home ductwork cleaned out.  He is concerned that he may be starting a Crohn's flare as he feels anal rectal swelling and inflammation and 3 days ago he saw a few spots of light red blood on the toilet tissue which has not occurred for the past 4 years.  He reported having a prior history of anal rectal abscess.  No purulent anorectal discharge.  No fevers.  No NSAID use.  He remains on Entyvio  every 6 weeks, next Entyvio  infusion is scheduled Friday, 01/28/2024.  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  He is going to [place] next week for vacation.     Past Medical History:  Diagnosis Date   Allergy    Anal fissure 2014   Colitis    COVID-19 02/15/2021   Crohn's disease (HCC)    GERD (gastroesophageal reflux disease)    Ileitis    Mouth ulcers    Perianal fistula    Perirectal abscess     Past Surgical History:  Procedure Laterality Date    COLONOSCOPY     UPPER GASTROINTESTINAL ENDOSCOPY     WISDOM TOOTH EXTRACTION      Family History  Problem Relation Age of Onset   Depression Mother    Anxiety disorder Mother    OCD Mother    Breast cancer Maternal Grandmother    Hypertension Maternal Grandfather    Heart block Maternal Grandfather    Crohn's disease Cousin    Colon cancer Neg Hx    Esophageal cancer Neg Hx    Stomach cancer Neg Hx    Rectal cancer Neg Hx     Social History:  reports that he has never smoked. He has never used smokeless tobacco. He reports that he does not currently use alcohol. He reports that he does not use drugs.  Allergies:  Allergies  Allergen Reactions   Humira  [Adalimumab ]     Developed Antibodies    Medications: I have reviewed the patient's current medications.  The PMH, PSH, Medications, Allergies, and SH were reviewed and updated.  ROS: Constitutional: Negative for fever, weight loss and weight gain. Cardiovascular: Negative for chest pain and dyspnea on exertion. Respiratory: Is not experiencing shortness of breath at rest. Gastrointestinal: Negative for nausea and vomiting. Neurological: Negative for headaches. Psychiatric: The patient is not nervous/anxious  Blood pressure 135/78, pulse 63, SpO2 97%. There is no height or weight on file to calculate BMI.  PHYSICAL EXAM:  Exam: General: Well-developed, well-nourished Communication and Voice: Clear pitch and clarity Respiratory Respiratory effort: Equal inspiration and expiration without stridor Cardiovascular Peripheral Vascular: Warm extremities with equal color/perfusion Eyes: No nystagmus with equal extraocular motion bilaterally Neuro/Psych/Balance: Patient oriented to person, place, and time; Appropriate mood and affect; Gait is intact with no imbalance; Cranial nerves I-XII are intact Head and Face Inspection: Normocephalic and atraumatic without mass or lesion Palpation: Facial skeleton intact without bony  stepoffs Salivary Glands: No mass or tenderness Facial Strength: Facial motility symmetric and full bilaterally ENT Pinna: External ear intact and fully developed External canal: Canal is patent with intact skin Tympanic Membrane: Clear and mobile External Nose: No scar or anatomic deformity Internal Nose: Septum is deviated to the left. No polyp, or purulence. Mucosal edema and erythema present.  Bilateral inferior turbinate hypertrophy.  Lips, Teeth, and gums: Mucosa and teeth intact and viable TMJ: No pain to palpation with full mobility Oral cavity/oropharynx: No erythema or exudate, no lesions present 2+ tonsils  Nasopharynx: No mass or lesion with intact mucosa Hypopharynx: Intact mucosa without pooling of secretions Neck Neck and Trachea: Midline trachea without mass or lesion Thyroid : No mass or nodularity Lymphatics: No lymphadenopathy  Procedure:   PROCEDURE NOTE: nasal endoscopy  Preoperative diagnosis: chronic sinusitis symptoms  Postoperative diagnosis: same  Procedure: Diagnostic nasal endoscopy (68768)  Surgeon: Elena Larry, M.D.  Anesthesia: Topical lidocaine and Afrin  H&P REVIEW: The patient's history and physical were reviewed today prior to procedure. All medications were reviewed and updated as well. Complications: None Condition is stable throughout  exam Indications and consent: The patient presents with symptoms of chronic sinusitis not responding to previous therapies. All the risks, benefits, and potential complications were reviewed with the patient preoperatively and informed consent was obtained. The time out was completed with confirmation of the correct procedure.   Procedure: The patient was seated upright in the clinic. Topical lidocaine and Afrin were applied to the nasal cavity. After adequate anesthesia had occurred, the rigid nasal endoscope was passed into the nasal cavity. The nasal mucosa, turbinates, septum, and sinus drainage  pathways were visualized bilaterally. This revealed no purulence or significant secretions that might be cultured. There were no polyps or sites of significant inflammation. The mucosa was intact and there was no crusting present. The scope was then slowly withdrawn and the patient tolerated the procedure well. There were no complications or blood loss.      Studies Reviewed: 05/11/21 CXR FINDINGS: The cardiomediastinal silhouette is normal in contour. No pleural effusion. No pneumothorax. No acute pleuroparenchymal abnormality. Visualized abdomen is unremarkable. No acute osseous abnormality noted.   IMPRESSION: No acute cardiopulmonary abnormality   Assessment/Plan: Encounter Diagnoses  Name Primary?   Chronic sinusitis, unspecified location Yes   Chronic nasal congestion    Environmental and seasonal allergies    Hypertrophy of both inferior nasal turbinates    Nasal septal deviation    Post-nasal drip    Anosmia    Nasal obstruction     Assessment and Plan Assessment & Plan Chronic nasal congestion with postnasal drainage and anosmia Chronic nasal congestion with postnasal drainage and anosmia likely due to environmental allergies. No polyps or pus observed on nasal endoscopy. Left septum narrows nasal passage, contributing to symptoms. No masses or lesions. Differential includes chronic sinusitis vs chronic nasal congestion 2/2 allergies. Reports several sinus infections requiring abx last school year. We discussed CT sinuses. We also discussed allergy testing.  - Prescribed Flonase  2 puff BID and Xyzal  5 mg daily. - Ordered sinus CT  - Referral to Allergy for testing   Deviated nasal septum and ITH Noted on nasal endoscopy with narrowing of the nasal passages. Will try medical management first - will consider septo/ITR if fails medical management as above   Thank you for allowing me to participate in the care of this patient. Please do not hesitate to contact me with  any questions or concerns.   Elena Larry, MD Otolaryngology [facility] Phone: [phone] Fax: [phone]    04/05/2024, 3:43 PM

## 2024-04-21 ENCOUNTER — Ambulatory Visit

## 2024-04-21 VITALS — BP 104/67 | HR 62 | Temp 98.3°F | Resp 14 | Ht 71.0 in | Wt 175.6 lb

## 2024-04-21 DIAGNOSIS — K5000 Crohn's disease of small intestine without complications: Secondary | ICD-10-CM | POA: Diagnosis not present

## 2024-04-21 DIAGNOSIS — K5010 Crohn's disease of large intestine without complications: Secondary | ICD-10-CM

## 2024-04-21 MED ORDER — VEDOLIZUMAB 300 MG IV SOLR
300.0000 mg | Freq: Once | INTRAVENOUS | Status: AC
  Administered 2024-04-21: 300 mg via INTRAVENOUS
  Filled 2024-04-21: qty 5

## 2024-04-21 NOTE — Progress Notes (Signed)
 Diagnosis: Crohn's Disease  Provider:  Praveen Mannam MD  Procedure: IV Infusion  IV Type: Peripheral, IV Location: L Antecubital  Entyvio  (Vedolizumab ), Dose: 300 mg  Infusion Start Time: 1611  Infusion Stop Time: 1643  Post Infusion IV Care: Peripheral IV Discontinued  Discharge: Condition: Good, Destination: Home . AVS Declined  Performed by:  Leita FORBES Miles, LPN

## 2024-05-09 ENCOUNTER — Ambulatory Visit (INDEPENDENT_AMBULATORY_CARE_PROVIDER_SITE_OTHER): Admitting: Allergy & Immunology

## 2024-05-09 ENCOUNTER — Other Ambulatory Visit: Payer: Self-pay

## 2024-05-09 ENCOUNTER — Encounter: Payer: Self-pay | Admitting: Allergy & Immunology

## 2024-05-09 VITALS — BP 116/72 | HR 74 | Temp 97.9°F | Resp 18 | Ht 69.69 in | Wt 175.8 lb

## 2024-05-09 DIAGNOSIS — J329 Chronic sinusitis, unspecified: Secondary | ICD-10-CM | POA: Diagnosis not present

## 2024-05-09 DIAGNOSIS — J310 Chronic rhinitis: Secondary | ICD-10-CM | POA: Diagnosis not present

## 2024-05-09 NOTE — Patient Instructions (Addendum)
 1. Recurrent sinusitis - We are not going to worry about an immune workup for now. - If you continue to have a lot of infections, we are be more aggressive with labs to look at your immune system.  2. Chronic rhinitis - Because of insurance stipulations, we cannot do skin testing on the same day as your first visit. - We are all working to fight this, but for now we need to do two separate visits.  - We will know more after we do testing at the next visit.  - The skin testing visit can be squeezed in at your convenience.  - Then we can make a more full plan to address all of your symptoms. - Be sure to stop your antihistamines for 3 days before this appointment.   3. Return in about 2 weeks (around 05/23/2024) for allergy testing (1-55) . You can have the follow up appointment with Dr. Iva or a Nurse Practicioner (our Nurse Practitioners are excellent and always have Physician oversight!).    Please inform us  of any Emergency Department visits, hospitalizations, or changes in symptoms. Call us  before going to the ED for breathing or allergy symptoms since we might be able to fit you in for a sick visit. Feel free to contact us  anytime with any questions, problems, or concerns.  It was a pleasure to meet you\ today!  Websites that have reliable patient information: 1. American Academy of Asthma, Allergy, and Immunology: www.aaaai.org 2. Food Allergy Research and Education (FARE): foodallergy.org 3. Mothers of Asthmatics: http://www.asthmacommunitynetwork.org 4. American College of Allergy, Asthma, and Immunology: www.acaai.org      "Like" us  on Facebook and Instagram for our latest updates!      A healthy democracy works best when [employer] participate! Make sure you are registered to vote! If you have moved or changed any of your contact information, you will need to get this updated before voting! Scan the QR codes below to learn more!

## 2024-05-09 NOTE — Progress Notes (Unsigned)
 NEW PATIENT  Date of Service/Encounter:  05/09/24  Consult requested by: Diedra Lame, MD   Assessment:   Recurrent sinusitis  Chronic rhinitis - planning to skin test at the next visit   Rightward septal deviation  Crohn's disease - on Entyvio   Plan/Recommendations:   There are no Patient Instructions on file for this visit.   {Blank single:19197::This note in its entirety was forwarded to the Provider who requested this consultation.}  Subjective:   Carlos Jackson is a 28 y.o. male presenting today for evaluation of  Chief Complaint  Patient presents with   Allergies    Nasal driip, sinus infections     Carlos Jackson has a history of the following: Patient Active Problem List   Diagnosis Date Noted   Acne 01/22/2024   Crohn's disease of anal canal (HCC) 11/11/2021   Crohn's disease involving terminal ileum (HCC) 11/11/2021   Acute tonsillitis 12/13/2019   Decreased sense of smell 12/13/2019   Mouth ulcers 12/13/2019    History obtained from: chart review and {Persons; PED relatives w/patient:19415::patient}.  Discussed the use of AI scribe software for clinical note transcription with the patient and/or guardian, who gave verbal consent to proceed.  Carlos Jackson was referred by Diedra Lame, MD.     Carlos Jackson is a 28 y.o. male presenting for {Blank single:19197::a food challenge,a drug challenge,skin testing,a sick visit,an evaluation of ***,a follow up visit}.    Asthma/Respiratory Symptom History: ***  Allergic Rhinitis Symptom History: ***  Food Allergy Symptom History: ***  Skin Symptom History: ***  GERD Symptom History: ***  Infection Symptom History: ***  ***Otherwise, there is no history of other atopic diseases, including {Blank multiple:19196:o:asthma,food allergies,drug allergies,environmental allergies,stinging insect allergies,eczema,urticaria,contact dermatitis}. There is no significant infectious  history. ***Vaccinations are up to date.    Past Medical History: Patient Active Problem List   Diagnosis Date Noted   Acne 01/22/2024   Crohn's disease of anal canal (HCC) 11/11/2021   Crohn's disease involving terminal ileum (HCC) 11/11/2021   Acute tonsillitis 12/13/2019   Decreased sense of smell 12/13/2019   Mouth ulcers 12/13/2019    Medication List:  Allergies as of 05/09/2024       Reactions   Humira  [adalimumab ]    Developed Antibodies        Medication List        Accurate as of May 09, 2024  2:49 PM. If you have any questions, ask your nurse or doctor.          doxycycline  100 MG capsule Commonly known as: VIBRAMYCIN  Take 1 capsule (100 mg total) by mouth 2 (two) times daily.   Entyvio  300 MG injection Generic drug: vedolizumab  Inject into the vein.   fluticasone  50 MCG/ACT nasal spray Commonly known as: FLONASE  Place 2 sprays into both nostrils 2 (two) times daily.   levocetirizine 5 MG tablet Commonly known as: Xyzal  Allergy 24HR Take 1 tablet (5 mg total) by mouth every evening.   montelukast 10 MG tablet Commonly known as: SINGULAIR Take 10 mg by mouth at bedtime.   PROBIOTIC PO Take 1 tablet by mouth 2 (two) times daily.        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 He has required no {Blank multiple:19196:a:speech therapy,occupational therapy,physical therapy}. ***non-contributory  Past Surgical History: Past Surgical History:  Procedure Laterality Date   COLONOSCOPY     UPPER GASTROINTESTINAL ENDOSCOPY  WISDOM TOOTH EXTRACTION       Family History: Family History  Problem Relation Age of Onset   Depression Mother    Anxiety disorder Mother    OCD Mother    Breast cancer Maternal Grandmother    Hypertension Maternal Grandfather    Heart block  Maternal Grandfather    Crohn's disease Cousin    Colon cancer Neg Hx    Esophageal cancer Neg Hx    Stomach cancer Neg Hx    Rectal cancer Neg Hx      Social History: Carlos Jackson lives at home with ***.    Review of systems otherwise negative other than that mentioned in the HPI.    Objective:   Blood pressure 116/72, pulse 74, temperature 97.9 F (36.6 C), temperature source Temporal, resp. rate 18, height 5' 9.69 (1.77 m), weight 175 lb 12.8 oz (79.7 kg), SpO2 97%. Body mass index is 25.45 kg/m.     Physical Exam   Diagnostic studies: {Blank single:19197::none,deferred due to recent antihistamine use,deferred due to insurance stipulations that require a separate visit for testing,labs sent instead, }  Spirometry: {Blank single:19197::results normal (FEV1: ***%, FVC: ***%, FEV1/FVC: ***%),results abnormal (FEV1: ***%, FVC: ***%, FEV1/FVC: ***%)}.    {Blank single:19197::Spirometry consistent with mild obstructive disease,Spirometry consistent with moderate obstructive disease,Spirometry consistent with severe obstructive disease,Spirometry consistent with possible restrictive disease,Spirometry consistent with mixed obstructive and restrictive disease,Spirometry uninterpretable due to technique,Spirometry consistent with normal pattern}. {Blank single:19197::Albuterol/Atrovent nebulizer,Xopenex/Atrovent nebulizer,Albuterol nebulizer,Albuterol four puffs via MDI,Xopenex four puffs via MDI} treatment given in clinic with {Blank single:19197::significant improvement in FEV1 per ATS criteria,significant improvement in FVC per ATS criteria,significant improvement in FEV1 and FVC per ATS criteria,improvement in FEV1, but not significant per ATS criteria,improvement in FVC, but not significant per ATS criteria,improvement in FEV1 and FVC, but not significant per ATS criteria,no improvement}.  Allergy Studies: {Blank  single:19197::none,deferred due to recent antihistamine use,deferred due to insurance stipulations that require a separate visit for testing,labs sent instead, }    {Blank single:19197::Allergy testing results were read and interpreted by myself, documented by clinical staff., }         Marty Shaggy, MD [facility] 

## 2024-05-11 ENCOUNTER — Encounter: Payer: Self-pay | Admitting: Allergy & Immunology

## 2024-05-23 ENCOUNTER — Ambulatory Visit: Admitting: Allergy & Immunology

## 2024-05-23 ENCOUNTER — Encounter: Payer: Self-pay | Admitting: Allergy & Immunology

## 2024-05-23 DIAGNOSIS — J302 Other seasonal allergic rhinitis: Secondary | ICD-10-CM

## 2024-05-23 DIAGNOSIS — J3089 Other allergic rhinitis: Secondary | ICD-10-CM

## 2024-05-23 MED ORDER — RYALTRIS 665-25 MCG/ACT NA SUSP: 2.0000 | Freq: Two times a day (BID) | NASAL | 5 refills | Status: AC | PRN

## 2024-05-23 MED ORDER — FEXOFENADINE HCL 180 MG PO TABS: 180.0000 mg | ORAL_TABLET | Freq: Every day | ORAL | 3 refills | Status: AC

## 2024-05-23 NOTE — Progress Notes (Signed)
 FOLLOW UP  Date of Service/Encounter:  05/23/24   Assessment:   Recurrent sinusitis - deferring on immune workup for now   Perennial and seasonal allergic rhinitis - planning to skin test at the next visit   Failed Flonase , Singulair, and Xyzal    Rightward septal deviation   Crohn's disease - on Entyvio  and doing well on this  Plan/Recommendations:   1. Recurrent sinusitis - We are not going to worry about an immune workup for now. - If you continue to have a lot of infections, we are be more aggressive with labs to look at your immune system.  2. Chronic rhinitis - Testing today showed: ragweed, trees, outdoor molds, dust mites, cat, dog, and cockroach - Copy of test results provided.  - Avoidance measures provided. - Start taking: Allegra (fexofenadine) 180mg  tablet once daily and Ryaltris (olopatadine/mometasone) two sprays per nostril 1-2 times daily as needed - You can use an extra dose of the antihistamine, if needed, for breakthrough symptoms.  - Consider nasal saline rinses 1-2 times daily to remove allergens from the nasal cavities as well as help with mucous clearance (this is especially helpful to do before the nasal sprays are given) - I would strongly consider allergy shots as a means of long-term control. - Allergy shots re-train and reset the immune system to ignore environmental allergens and decrease the resulting immune response to those allergens (sneezing, itchy watery eyes, runny nose, nasal congestion, etc).    - Allergy shots improve symptoms in 75-85% of patients.  - CPT codes provided. - Make an appointment to start shots on your way out today or call us  back when you make a decision.  - This is a commitment early on when you are building up, but once you get to maintenance it is only 1 shot visit every month for 3 to 5 years to retrain your immune system.  3. Return in about 3 months (around 08/23/2024). You can have the follow up appointment with  Dr. Iva or a Nurse Practicioner (our Nurse Practitioners are excellent and always have Physician oversight!).    Subjective:   Carlos Jackson is a 28 y.o. male presenting today for follow up of No chief complaint on file.   Carlos Jackson has a history of the following: Patient Active Problem List   Diagnosis Date Noted   Acne 01/22/2024   Crohn's disease of anal canal (HCC) 11/11/2021   Crohn's disease involving terminal ileum (HCC) 11/11/2021   Acute tonsillitis 12/13/2019   Decreased sense of smell 12/13/2019   Mouth ulcers 12/13/2019    History obtained from: chart review and patient.  Discussed the use of AI scribe software for clinical note transcription with the patient and/or guardian, who gave verbal consent to proceed.  Carlos Jackson is a 28 y.o. male presenting for skin testing. He was last seen on September 2025. We could not do testing because his insurance company does not cover testing on the same day as a New Patient visit. He has been off of all antihistamines 3 days in anticipation of the testing.    He experiences worsening allergy symptoms, particularly in the fall. He does not use dust mite covers on his bed or pillows. He owns a cat, which exacerbates his symptoms, but the cat remains outdoors and does not enter the house.  He reports symptoms such as runny nose and runny eyes.  He is currently taking cetirizine, which was increased to three times daily, and omeprazole for GERD. Benadryl  was stopped due to itching.  Recent lab results showed decreased kidney function, slightly elevated tryptase levels, and a low hemoglobin consistent with his baseline. Alpha-gal testing was negative, and ESR, CRP, and SPEP were normal.  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  His wife works as a Social worker for 3 children.  Otherwise, there have been no changes to his past medical history, surgical history, family history, or social history.    Review of  systems otherwise negative other than that mentioned in the HPI.    Objective:   There were no vitals taken for this visit. There is no height or weight on file to calculate BMI.    Physical exam deferred since this was a skin testing appointment only.   Diagnostic studies:   Allergy Studies:     Airborne Adult Perc - 05/23/24 1453     Allergen Manufacturer Jestine    Location Back    Number of Test 55    1. Control-Buffer 50% Glycerol Negative    2. Control-Histamine 2+    3. Bahia Negative    4. French [place] Territ[place] Negative    5. Johnson Negative    6. [place]  Blue Negative    7. Meadow Fescue Negative    8. Perennial Rye Negative    9. Timothy Negative    10. Ragweed Mix Negative    11. Cocklebur Negative    12. Plantain,  English Negative    13. Baccharis Negative    14. Dog Fennel Negative    15. Russian Thistle Negative    16. Lamb's Quarters Negative    17. Sheep Sorrell Negative    18. Rough Pigweed Negative    19. Marsh Elder, Rough Negative    20. Mugwort, Common Negative    21. Box, Elder 3+    22. Cedar, red Negative    23. Sweet Gum Negative    24. Pecan Pollen Negative    25. Pine Mix Negative    26. Walnut, Black Pollen Negative    27. Red Mulberry Negative    28. Ash Mix Negative    29. Birch Mix Negative    30. Beech American Negative    31. Cottonwood, [place] Negative    32. Hickory, White Negative    33. Maple Mix Negative    34. Oak, [place] Mix 3+    35. Sycamore Eastern Negative    36. Alternaria Alternata Negative    37. Cladosporium Herbarum Negative    38. Aspergillus Mix Negative    39. Penicillium Mix Negative    40. Bipolaris Sorokiniana (Helminthosporium) Negative    41. Drechslera Spicifera (Curvularia) Negative    42. Mucor Plumbeus Negative    43. Fusarium Moniliforme Negative    44. Aureobasidium Pullulans (pullulara) Negative    45. Rhizopus Oryzae Negative    46. Botrytis Cinera Negative    47. Epicoccum Nigrum Negative     48. Phoma Betae Negative    49. Dust Mite Mix 4+    50. Cat Hair 10,000 BAU/ml 2+    51.  Dog Epithelia Negative    52. Mixed Feathers Negative    53. Horse Epithelia Negative    54. Cockroach, German Negative    55. Tobacco Leaf Negative          Intradermal - 05/23/24 1514     Time Antigen Placed 1515    Allergen Manufacturer Jestine    Location Arm    Control Negative  Bahia Negative    French [place] Territories Negative    Johnson Negative    7 Grass Negative    Ragweed Mix 1+    Weed Mix Negative    Mold 1 Negative    Mold 2 Negative    Mold 3 2+    Mold 4 Negative    Dog 2+    Cockroach 2+          Allergy testing results were read and interpreted by myself, documented by clinical staff.      Carlos Shaggy, MD  [facility] 

## 2024-05-23 NOTE — Patient Instructions (Addendum)
 1. Recurrent sinusitis - We are not going to worry about an immune workup for now. - If you continue to have a lot of infections, we are be more aggressive with labs to look at your immune system.  2. Chronic rhinitis - Testing today showed: ragweed, trees, outdoor molds, dust mites, cat, dog, and cockroach - Copy of test results provided.  - Avoidance measures provided. - Start taking: Allegra (fexofenadine) 180mg  tablet once daily and Ryaltris (olopatadine/mometasone) two sprays per nostril 1-2 times daily as needed - You can use an extra dose of the antihistamine, if needed, for breakthrough symptoms.  - Consider nasal saline rinses 1-2 times daily to remove allergens from the nasal cavities as well as help with mucous clearance (this is especially helpful to do before the nasal sprays are given) - I would strongly consider allergy shots as a means of long-term control. - Allergy shots re-train and reset the immune system to ignore environmental allergens and decrease the resulting immune response to those allergens (sneezing, itchy watery eyes, runny nose, nasal congestion, etc).    - Allergy shots improve symptoms in 75-85% of patients.  - CPT codes provided. - Make an appointment to start shots on your way out today or call us  back when you make a decision.  - This is a commitment early on when you are building up, but once you get to maintenance it is only 1 shot visit every month for 3 to 5 years to retrain your immune system.  3. Return in about 3 months (around 08/23/2024). You can have the follow up appointment with Dr. Iva or a Nurse Practicioner (our Nurse Practitioners are excellent and always have Physician oversight!).    Please inform us  of any Emergency Department visits, hospitalizations, or changes in symptoms. Call us  before going to the ED for breathing or allergy symptoms since we might be able to fit you in for a sick visit. Feel free to contact us  anytime with  any questions, problems, or concerns.  It was a pleasure to see you again today!  Websites that have reliable patient information: 1. American Academy of Asthma, Allergy, and Immunology: www.aaaai.org 2. Food Allergy Research and Education (FARE): foodallergy.org 3. Mothers of Asthmatics: http://www.asthmacommunitynetwork.org 4. American College of Allergy, Asthma, and Immunology: www.acaai.org      "Like" us  on Facebook and Instagram for our latest updates!      A healthy democracy works best when [employer] participate! Make sure you are registered to vote! If you have moved or changed any of your contact information, you will need to get this updated before voting! Scan the QR codes below to learn more!        Airborne Adult Perc - 05/23/24 1453     Allergen Manufacturer Jestine    Location Back    Number of Test 55    1. Control-Buffer 50% Glycerol Negative    2. Control-Histamine 2+    3. Bahia Negative    4. [place] Negative    5. Johnson Negative    6. [place]  Blue Negative    7. Meadow Fescue Negative    8. Perennial Rye Negative    9. Timothy Negative    10. Ragweed Mix Negative    11. Cocklebur Negative    12. Plantain,  English Negative    13. Baccharis Negative    14. Dog Fennel Negative    15. Russian Thistle Negative    16. Lamb's Quarters Negative    17.  Sheep Sorrell Negative    18. Rough Pigweed Negative    19. Marsh Elder, Rough Negative    20. Mugwort, Common Negative    21. Box, Elder 3+    22. Cedar, red Negative    23. Sweet Gum Negative    24. Pecan Pollen Negative    25. Pine Mix Negative    26. Walnut, Black Pollen Negative    27. Red Mulberry Negative    28. Ash Mix Negative    29. Birch Mix Negative    30. Beech American Negative    31. Cottonwood, [place] Negative    32. Hickory, White Negative    33. Maple Mix Negative    34. Oak, [place] Mix 3+    35. Sycamore Eastern Negative    36. Alternaria Alternata Negative    37.  Cladosporium Herbarum Negative    38. Aspergillus Mix Negative    39. Penicillium Mix Negative    40. Bipolaris Sorokiniana (Helminthosporium) Negative    41. Drechslera Spicifera (Curvularia) Negative    42. Mucor Plumbeus Negative    43. Fusarium Moniliforme Negative    44. Aureobasidium Pullulans (pullulara) Negative    45. Rhizopus Oryzae Negative    46. Botrytis Cinera Negative    47. Epicoccum Nigrum Negative    48. Phoma Betae Negative    49. Dust Mite Mix 4+    50. Cat Hair 10,000 BAU/ml 2+    51.  Dog Epithelia Negative    52. Mixed Feathers Negative    53. Horse Epithelia Negative    54. Cockroach, German Negative    55. Tobacco Leaf Negative          Intradermal - 05/23/24 1514     Time Antigen Placed 1515    Allergen Manufacturer Jestine    Location Arm    Control Negative    Bahia Negative    [place] Negative    Johnson Negative    7 Grass Negative    Ragweed Mix 1+    Weed Mix Negative    Mold 1 Negative    Mold 2 Negative    Mold 3 2+    Mold 4 Negative    Dog 2+    Cockroach 2+          Reducing Pollen Exposure  The American Academy of Allergy, Asthma and Immunology suggests the following steps to reduce your exposure to pollen during allergy seasons.    Do not hang sheets or clothing out to dry; pollen may collect on these items. Do not mow lawns or spend time around freshly cut grass; mowing stirs up pollen. Keep windows closed at night.  Keep car windows closed while driving. Minimize morning activities outdoors, a time when pollen counts are usually at their highest. Stay indoors as much as possible when pollen counts or humidity is high and on windy days when pollen tends to remain in the air longer. Use air conditioning when possible.  Many air conditioners have filters that trap the pollen spores. Use a HEPA room air filter to remove pollen form the indoor air you breathe.  Control of Mold Allergen   Mold and fungi can grow on a variety  of surfaces provided certain temperature and moisture conditions exist.  Outdoor molds grow on plants, decaying vegetation and soil.  The major outdoor mold, Alternaria and Cladosporium, are found in very high numbers during hot and dry conditions.  Generally, a late Summer - Fall peak is seen for common  outdoor fungal spores.  Rain will temporarily lower outdoor mold spore count, but counts rise rapidly when the rainy period ends.  The most important indoor molds are Aspergillus and Penicillium.  Dark, humid and poorly ventilated basements are ideal sites for mold growth.  The next most common sites of mold growth are the bathroom and the kitchen.  Outdoor (Seasonal) Mold Control  Positive outdoor molds via skin testing: Bipolaris (Helminthsporium), Drechslera (Curvalaria), and Mucor  Use air conditioning and keep windows closed Avoid exposure to decaying vegetation. Avoid leaf raking. Avoid grain handling. Consider wearing a face mask if working in moldy areas.      Control of Dust Mite Allergen    Dust mites play a major role in allergic asthma and rhinitis.  They occur in environments with high humidity wherever human skin is found.  Dust mites absorb humidity from the atmosphere (ie, they do not drink) and feed on organic matter (including shed human and animal skin).  Dust mites are a microscopic type of insect that you cannot see with the naked eye.  High levels of dust mites have been detected from mattresses, pillows, carpets, upholstered furniture, bed covers, clothes, soft toys and any woven material.  The principal allergen of the dust mite is found in its feces.  A gram of dust may contain 1,000 mites and 250,000 fecal particles.  Mite antigen is easily measured in the air during house cleaning activities.  Dust mites do not bite and do not cause harm to humans, other than by triggering allergies/asthma.    Ways to decrease your exposure to dust mites in your home:  Encase  mattresses, box springs and pillows with a mite-impermeable barrier or cover   Wash sheets, blankets and drapes weekly in hot water (130 F) with detergent and dry them in a dryer on the hot setting.  Have the room cleaned frequently with a vacuum cleaner and a damp dust-mop.  For carpeting or rugs, vacuuming with a vacuum cleaner equipped with a high-efficiency particulate air (HEPA) filter.  The dust mite allergic individual should not be in a room which is being cleaned and should wait 1 hour after cleaning before going into the room. Do not sleep on upholstered furniture (eg, couches).   If possible removing carpeting, upholstered furniture and drapery from the home is ideal.  Horizontal blinds should be eliminated in the rooms where the person spends the most time (bedroom, study, television room).  Washable vinyl, roller-type shades are optimal. Remove all non-washable stuffed toys from the bedroom.  Wash stuffed toys weekly like sheets and blankets above.   Reduce indoor humidity to less than 50%.  Inexpensive humidity monitors can be purchased at most hardware stores.  Do not use a humidifier as can make the problem worse and are not recommended.  Control of Dog or Cat Allergen  Avoidance is the best way to manage a dog or cat allergy. If you have a dog or cat and are allergic to dog or cats, consider removing the dog or cat from the home. If you have a dog or cat but don't want to find it a new home, or if your family wants a pet even though someone in the household is allergic, here are some strategies that may help keep symptoms at bay:  Keep the pet out of your bedroom and restrict it to only a few rooms. Be advised that keeping the dog or cat in only one room will not limit the allergens  to that room. Don't pet, hug or kiss the dog or cat; if you do, wash your hands with soap and water. High-efficiency particulate air (HEPA) cleaners run continuously in a bedroom or living room can reduce  allergen levels over time. Regular use of a high-efficiency vacuum cleaner or a central vacuum can reduce allergen levels. Giving your dog or cat a bath at least once a week can reduce airborne allergen.  Control of Cockroach Allergen  Cockroach allergen has been identified as an important cause of acute attacks of asthma, especially in urban settings.  There are fifty-five species of cockroach that exist in the [place] , however only three, the [place], [place] and [place] species produce allergen that can affect patients with Asthma.  Allergens can be obtained from fecal particles, egg casings and secretions from cockroaches.    Remove food sources. Reduce access to water. Seal access and entry points. Spray runways with 0.5-1% Diazinon or Chlorpyrifos Blow boric acid power under stoves and refrigerator. Place bait stations (hydramethylnon) at feeding sites.  Allergy Shots  Allergies are the result of a chain reaction that starts in the immune system. Your immune system controls how your body defends itself. For instance, if you have an allergy to pollen, your immune system identifies pollen as an invader or allergen. Your immune system overreacts by producing antibodies called Immunoglobulin E (IgE). These antibodies travel to cells that release chemicals, causing an allergic reaction.  The concept behind allergy immunotherapy, whether it is received in the form of shots or tablets, is that the immune system can be desensitized to specific allergens that trigger allergy symptoms. Although it requires time and patience, the payback can be long-term relief. Allergy injections contain a dilute solution of those substances that you are allergic to based upon your skin testing and allergy history.   How Do Allergy Shots Work?  Allergy shots work much like a vaccine. Your body responds to injected amounts of a particular allergen given in increasing doses, eventually developing a resistance  and tolerance to it. Allergy shots can lead to decreased, minimal or no allergy symptoms.  There generally are two phases: build-up and maintenance. Build-up often ranges from three to six months and involves receiving injections with increasing amounts of the allergens. The shots are typically given once or twice a week, though more rapid build-up schedules are sometimes used.  The maintenance phase begins when the most effective dose is reached. This dose is different for each person, depending on how allergic you are and your response to the build-up injections. Once the maintenance dose is reached, there are longer periods between injections, typically two to four weeks.  Occasionally doctors give cortisone-type shots that can temporarily reduce allergy symptoms. These types of shots are different and should not be confused with allergy immunotherapy shots.  Who Can Be Treated with Allergy Shots?  Allergy shots may be a good treatment approach for people with allergic rhinitis (hay fever), allergic asthma, conjunctivitis (eye allergy) or stinging insect allergy.   Before deciding to begin allergy shots, you should consider:   The length of allergy season and the severity of your symptoms  Whether medications and/or changes to your environment can control your symptoms  Your desire to avoid long-term medication use  Time: allergy immunotherapy requires a major time commitment  Cost: may vary depending on your insurance coverage  Allergy shots for children age 63 and older are effective and often well tolerated. They might prevent the onset of new  allergen sensitivities or the progression to asthma.  Allergy shots are not started on patients who are pregnant but can be continued on patients who become pregnant while receiving them. In some patients with other medical conditions or who take certain common medications, allergy shots may be of risk. It is important to mention other medications  you talk to your allergist.   What are the two types of build-ups offered:   RUSH or Rapid Desensitization -- one day of injections lasting from 8:30-4:30pm, injections every 1 hour.  Approximately half of the build-up process is completed in that one day.  The following week, normal build-up is resumed, and this entails ~16 visits either weekly or twice weekly, until reaching your "maintenance dose" which is continued weekly until eventually getting spaced out to every month for a duration of 3 to 5 years. The regular build-up appointments are nurse visits where the injections are administered, followed by required monitoring for 30 minutes.    Traditional build-up -- weekly visits for 6 -12 months until reaching "maintenance dose", then continue weekly until eventually spacing out to every 4 weeks as above. At these appointments, the injections are administered, followed by required monitoring for 30 minutes.     Either way is acceptable, and both are equally effective. With the rush protocol, the advantage is that less time is spent here for injections overall AND you would also reach maintenance dosing faster (which is when the clinical benefit starts to become more apparent). Not everyone is a candidate for rapid desensitization.   IF we proceed with the RUSH protocol, there are premedications which must be taken the day before and the day after the rush only (this includes antihistamines, steroids, and Singulair).  After the rush day, no prednisone  or Singulair is required, and we just recommend antihistamines taken on your injection day.  What Is An Estimate of the Costs?  If you are interested in starting allergy injections, please check with your insurance company about your coverage for both allergy vial sets and allergy injections.  Please do so prior to making the appointment to start injections.  The following are CPT codes to give to your insurance company. These are the amounts we BILL  to the insurance company, but the amount YOU WILL PAY and WE RECEIVE IS SUBSTANTIALLY LESS and depends on the contracts we have with different insurance companies.   Amount Billed to Insurance Two allergy vial set  CPT 95165   $ 2400     Two injections   CPT 95117   $ 40  Regarding the allergy injections, your co-pay may or may not apply with each injection, so please confirm this with your insurance company. When you start allergy injections, 1 or 2 sets of vials are made based on your allergies.  Not all patients can be on one set of vials. A set of vials lasts 6 months to a year depending on how quickly you can proceed with your build-up of your allergy injections. Vials are personalized for each patient depending on their specific allergens.  How often are allergy injection given during the build-up period?   Injections are given at least weekly during the build-up period until your maintenance dose is achieved. Per the doctor's discretion, you may have the option of getting allergy injections two times per week during the build-up period. However, there must be at least 48 hours between injections. The build-up period is usually completed within 6-12 months depending on your ability to  schedule injections and for adjustments for reactions. When maintenance dose is reached, your injection schedule is gradually changed to every two weeks and later to every three weeks. Injections will then continue every 4 weeks. Usually, injections are continued for a total of 3-5 years.   When Will I Feel Better?  Some may experience decreased allergy symptoms during the build-up phase. For others, it may take as long as 12 months on the maintenance dose. If there is no improvement after a year of maintenance, your allergist will discuss other treatment options with you.  If you aren't responding to allergy shots, it may be because there is not enough dose of the allergen in your vaccine or there are missing  allergens that were not identified during your allergy testing. Other reasons could be that there are high levels of the allergen in your environment or major exposure to non-allergic triggers like tobacco smoke.  What Is the Length of Treatment?  Once the maintenance dose is reached, allergy shots are generally continued for three to five years. The decision to stop should be discussed with your allergist at that time. Some people may experience a permanent reduction of allergy symptoms. Others may relapse and a longer course of allergy shots can be considered.  What Are the Possible Reactions?  The two types of adverse reactions that can occur with allergy shots are local and systemic. Common local reactions include very mild redness and swelling at the injection site, which can happen immediately or several hours after. Report a delayed reaction from your last injection. These include arm swelling or runny nose, watery eyes or cough that occurs within 12-24 hours after injection. A systemic reaction, which is less common, affects the entire body or a particular body system. They are usually mild and typically respond quickly to medications. Signs include increased allergy symptoms such as sneezing, a stuffy nose or hives.   Rarely, a serious systemic reaction called anaphylaxis can develop. Symptoms include swelling in the throat, wheezing, a feeling of tightness in the chest, nausea or dizziness. Most serious systemic reactions develop within 30 minutes of allergy shots. This is why it is strongly recommended you wait in your doctor's office for 30 minutes after your injections. Your allergist is trained to watch for reactions, and his or her staff is trained and equipped with the proper medications to identify and treat them.   Report to the nurse immediately if you experience any of the following symptoms: swelling, itching or redness of the skin, hives, watery eyes/nose, breathing difficulty,  excessive sneezing, coughing, stomach pain, diarrhea, or light headedness. These symptoms may occur within 15-20 minutes after injection and may require medication.   Who Should Administer Allergy Shots?  The preferred location for receiving shots is your prescribing allergist's office. Injections can sometimes be given at another facility where the physician and staff are trained to recognize and treat reactions, and have received instructions by your prescribing allergist.  What if I am late for an injection?   Injection dose will be adjusted depending upon how many days or weeks you are late for your injection.   What if I am sick?   Please report any illness to the nurse before receiving injections. She may adjust your dose or postpone injections depending on your symptoms. If you have fever, flu, sinus infection or chest congestion it is best to postpone allergy injections until you are better. Never get an allergy injection if your asthma is causing you problems.  If your symptoms persist, seek out medical care to get your health problem under control.  What If I am or Become Pregnant:  Women that become pregnant should schedule an appointment with [facility] before receiving any further allergy injections.  Check out this information handout from the [employer] of Asthma, Allergy, and Immunology on allergy shots!   [email or web address]

## 2024-06-02 ENCOUNTER — Ambulatory Visit (INDEPENDENT_AMBULATORY_CARE_PROVIDER_SITE_OTHER)

## 2024-06-02 VITALS — BP 104/65 | HR 50 | Temp 98.3°F | Resp 16 | Ht 71.0 in | Wt 178.4 lb

## 2024-06-02 DIAGNOSIS — K5010 Crohn's disease of large intestine without complications: Secondary | ICD-10-CM

## 2024-06-02 DIAGNOSIS — K5000 Crohn's disease of small intestine without complications: Secondary | ICD-10-CM | POA: Diagnosis not present

## 2024-06-02 MED ORDER — VEDOLIZUMAB 300 MG IV SOLR
300.0000 mg | Freq: Once | INTRAVENOUS | Status: AC
  Administered 2024-06-02: 300 mg via INTRAVENOUS
  Filled 2024-06-02: qty 5

## 2024-06-02 NOTE — Progress Notes (Signed)
 Diagnosis: Crohn's Disease  Provider:  Praveen Mannam MD  Procedure: IV Infusion  IV Type: Peripheral, IV Location: R Antecubital  Evinity (Romosozumab-aqqg), Dose: 300 mg  Infusion Start Time: 1538  Infusion Stop Time: 1609  Post Infusion IV Care: Peripheral IV Discontinued  Discharge: Condition: Good, Destination: Home . AVS Declined  Performed by:  Waddell LOISE Freshwater, RN

## 2024-07-17 ENCOUNTER — Ambulatory Visit

## 2024-07-17 VITALS — BP 108/67 | HR 50 | Temp 98.3°F | Resp 18 | Ht 71.0 in | Wt 184.4 lb

## 2024-07-17 DIAGNOSIS — K5000 Crohn's disease of small intestine without complications: Secondary | ICD-10-CM

## 2024-07-17 DIAGNOSIS — K5010 Crohn's disease of large intestine without complications: Secondary | ICD-10-CM

## 2024-07-17 MED ORDER — VEDOLIZUMAB 300 MG IV SOLR
300.0000 mg | Freq: Once | INTRAVENOUS | Status: AC
  Administered 2024-07-17: 300 mg via INTRAVENOUS
  Filled 2024-07-17: qty 5

## 2024-07-17 NOTE — Progress Notes (Signed)
 Diagnosis:  Crohn's Disease  Provider:  Mannam, Praveen MD  Procedure: IV Infusion  IV Type: Peripheral, IV Location: L Antecubital  Entyvio  (Vedolizumab ), Dose: 300 mg  Infusion Start Time: 1519  Infusion Stop Time: 1600  Post Infusion IV Care: Peripheral IV Discontinued  Discharge: Condition: Good, Destination: Home . AVS Declined  Performed by:  Eleanor DELENA Bloch, RN

## 2024-08-28 ENCOUNTER — Ambulatory Visit

## 2024-08-28 ENCOUNTER — Ambulatory Visit (INDEPENDENT_AMBULATORY_CARE_PROVIDER_SITE_OTHER): Admitting: *Deleted

## 2024-08-28 ENCOUNTER — Encounter: Payer: Self-pay | Admitting: Internal Medicine

## 2024-08-28 ENCOUNTER — Telehealth: Payer: Self-pay | Admitting: Pharmacy Technician

## 2024-08-28 VITALS — BP 111/70 | HR 47 | Temp 97.9°F | Resp 16 | Ht 71.0 in | Wt 177.6 lb

## 2024-08-28 DIAGNOSIS — K5010 Crohn's disease of large intestine without complications: Secondary | ICD-10-CM | POA: Diagnosis not present

## 2024-08-28 DIAGNOSIS — K5000 Crohn's disease of small intestine without complications: Secondary | ICD-10-CM

## 2024-08-28 MED ORDER — VEDOLIZUMAB 300 MG IV SOLR
300.0000 mg | Freq: Once | INTRAVENOUS | Status: AC
  Administered 2024-08-28: 300 mg via INTRAVENOUS
  Filled 2024-08-28: qty 5

## 2024-08-28 NOTE — Telephone Encounter (Signed)
 Auth Submission: APPROVED Site of care: Site of care: [facility] Payer: [employer] Medication & CPT/J Code(s) submitted: Entyvio  (Vedolizumab ) J3380 Diagnosis Code: K50.00 Route of submission (phone, fax, portal):  Phone # Fax # Auth type: Buy/Bill PB Units/visits requested: 300MG  [id number] Reference number: [id number] Approval from: 10/18/23 to 10/17/24

## 2024-08-28 NOTE — Progress Notes (Signed)
 Diagnosis:  Crohn's Disease  Provider:  Praveen Mannam MD  Procedure: IV Infusion  IV Type: Peripheral, IV Location: L Antecubital   Entyvio  (Vedolizumab ), Dose: 300 mg  Infusion Start Time: 1531  Infusion Stop Time: 1605  Post Infusion IV Care: Peripheral IV Discontinued  Discharge: Condition: Good, Destination: Home . AVS Declined  Performed by:  Leita FORBES Miles, LPN

## 2024-08-29 ENCOUNTER — Ambulatory Visit: Admitting: Allergy & Immunology

## 2024-08-29 ENCOUNTER — Ambulatory Visit

## 2024-10-09 ENCOUNTER — Ambulatory Visit

## 2024-10-10 ENCOUNTER — Ambulatory Visit: Admitting: Allergy & Immunology
